# Patient Record
Sex: Female | Born: 1970 | Race: White | Hispanic: No | Marital: Married | Smoking: Never smoker
Health system: Southern US, Community
[De-identification: ages and names within clinical notes are randomized; demographics above are authoritative.]

## PROBLEM LIST (undated history)

## (undated) DIAGNOSIS — K589 Irritable bowel syndrome without diarrhea: Secondary | ICD-10-CM

## (undated) DIAGNOSIS — Z9289 Personal history of other medical treatment: Secondary | ICD-10-CM

## (undated) DIAGNOSIS — Z803 Family history of malignant neoplasm of breast: Secondary | ICD-10-CM

## (undated) DIAGNOSIS — L709 Acne, unspecified: Secondary | ICD-10-CM

## (undated) DIAGNOSIS — J302 Other seasonal allergic rhinitis: Secondary | ICD-10-CM

## (undated) DIAGNOSIS — Z1379 Encounter for other screening for genetic and chromosomal anomalies: Secondary | ICD-10-CM

## (undated) DIAGNOSIS — Z9189 Other specified personal risk factors, not elsewhere classified: Secondary | ICD-10-CM

## (undated) HISTORY — DX: Encounter for other screening for genetic and chromosomal anomalies: Z13.79

## (undated) HISTORY — DX: Other specified personal risk factors, not elsewhere classified: Z91.89

## (undated) HISTORY — PX: WISDOM TOOTH EXTRACTION: SHX21

## (undated) HISTORY — PX: MRI: SHX5353

## (undated) HISTORY — DX: Family history of malignant neoplasm of breast: Z80.3

## (undated) HISTORY — DX: Personal history of other medical treatment: Z92.89

## (undated) HISTORY — DX: Other seasonal allergic rhinitis: J30.2

## (undated) HISTORY — DX: Acne, unspecified: L70.9

---

## 2009-09-05 ENCOUNTER — Emergency Department: Payer: Self-pay | Admitting: Unknown Physician Specialty

## 2012-03-22 DIAGNOSIS — Z9289 Personal history of other medical treatment: Secondary | ICD-10-CM

## 2012-03-22 HISTORY — DX: Personal history of other medical treatment: Z92.89

## 2012-05-01 DIAGNOSIS — Z9289 Personal history of other medical treatment: Secondary | ICD-10-CM

## 2012-05-01 HISTORY — DX: Personal history of other medical treatment: Z92.89

## 2012-11-06 DIAGNOSIS — Z9189 Other specified personal risk factors, not elsewhere classified: Secondary | ICD-10-CM

## 2012-11-06 HISTORY — DX: Other specified personal risk factors, not elsewhere classified: Z91.89

## 2013-03-26 ENCOUNTER — Other Ambulatory Visit: Payer: Self-pay | Admitting: *Deleted

## 2013-03-26 DIAGNOSIS — Z803 Family history of malignant neoplasm of breast: Secondary | ICD-10-CM

## 2013-05-06 HISTORY — PX: BREAST BIOPSY: SHX20

## 2013-05-27 ENCOUNTER — Other Ambulatory Visit: Payer: Self-pay | Admitting: Radiology

## 2013-05-27 DIAGNOSIS — Z803 Family history of malignant neoplasm of breast: Secondary | ICD-10-CM

## 2013-06-02 ENCOUNTER — Ambulatory Visit
Admission: RE | Admit: 2013-06-02 | Discharge: 2013-06-02 | Disposition: A | Discharge status: Home / Self Care | Payer: 59 | Source: Ambulatory Visit | Attending: *Deleted | Admitting: *Deleted

## 2013-06-02 DIAGNOSIS — Z803 Family history of malignant neoplasm of breast: Secondary | ICD-10-CM

## 2013-06-02 MED ORDER — GADOBENATE DIMEGLUMINE 529 MG/ML IV SOLN
13.0000 mL | Freq: Once | INTRAVENOUS | Status: AC | PRN
  Administered 2013-06-02: 13 mL via INTRAVENOUS

## 2013-06-06 ENCOUNTER — Ambulatory Visit (INDEPENDENT_AMBULATORY_CARE_PROVIDER_SITE_OTHER): Payer: 59 | Admitting: General Surgery

## 2013-06-06 ENCOUNTER — Encounter (INDEPENDENT_AMBULATORY_CARE_PROVIDER_SITE_OTHER): Payer: Self-pay | Admitting: General Surgery

## 2013-06-06 VITALS — BP 122/72 | HR 72 | Temp 99.1°F | Resp 15 | Ht 67.0 in | Wt 148.0 lb

## 2013-06-06 DIAGNOSIS — R928 Other abnormal and inconclusive findings on diagnostic imaging of breast: Secondary | ICD-10-CM

## 2013-06-06 DIAGNOSIS — Z1379 Encounter for other screening for genetic and chromosomal anomalies: Secondary | ICD-10-CM

## 2013-06-06 HISTORY — DX: Encounter for other screening for genetic and chromosomal anomalies: Z13.79

## 2013-06-06 NOTE — Patient Instructions (Signed)
[facility] °Office Phone Number [phone] ° °BREAST BIOPSY/ PARTIAL MASTECTOMY: POST OP INSTRUCTIONS ° °Always review your discharge instruction sheet given to you by the facility where your surgery was performed. ° °IF YOU HAVE DISABILITY OR FAMILY LEAVE FORMS, YOU MUST BRING THEM TO THE OFFICE FOR PROCESSING.  DO NOT GIVE THEM TO YOUR DOCTOR. ° °1. A prescription for pain medication may be given to you upon discharge.  Take your pain medication as prescribed, if needed.  If narcotic pain medicine is not needed, then you may take acetaminophen (Tylenol), naprosyn (Alleve) or ibuprofen (Advil) as needed. °2. Take your usually prescribed medications unless otherwise directed °3. If you need a refill on your pain medication, please contact your pharmacy.  They will contact our office to request authorization.  Prescriptions will not be filled after 5pm or on week-ends. °4. You should eat very light the first 24 hours after surgery, such as soup, crackers, pudding, etc.  Resume your normal diet the day after surgery. °5. Most patients will experience some swelling and bruising in the breast.  Ice packs and a good support bra will help.  Wear the breast binder provided or a sports bra for 72 hours day and night.  After that wear a sports bra during the day until you return to the office. Swelling and bruising can take several days to resolve.  °6. It is common to experience some constipation if taking pain medication after surgery.  Increasing fluid intake and taking a stool softener will usually help or prevent this problem from occurring.  A mild laxative (Milk of Magnesia or Miralax) should be taken according to package directions if there are no bowel movements after 48 hours. °7. Unless discharge instructions indicate otherwise, you may remove your bandages 48 hours after surgery and you may shower at that time.  You may have steri-strips (small skin tapes) in place directly over the incision.   These strips should be left on the skin for 7-10 days and will come off on their own.  If your surgeon used skin glue on the incision, you may shower in 24 hours.  The glue will flake off over the next 2-3 weeks.  Any sutures or staples will be removed at the office during your follow-up visit. °8. ACTIVITIES:  You may resume regular daily activities (gradually increasing) beginning the next day.  Wearing a good support bra or sports bra minimizes pain and swelling.  You may have sexual intercourse when it is comfortable. °a. You may drive when you no longer are taking prescription pain medication, you can comfortably wear a seatbelt, and you can safely maneuver your car and apply brakes. °b. RETURN TO WORK:  ______________________________________________________________________________________ °9. You should see your doctor in the office for a follow-up appointment approximately two weeks after your surgery.  Your doctor’s nurse will typically make your follow-up appointment when she calls you with your pathology report.  Expect your pathology report 3-4 business days after your surgery.  You may call to check if you do not hear from us after three days. °10. OTHER INSTRUCTIONS: _______________________________________________________________________________________________ _____________________________________________________________________________________________________________________________________ °_____________________________________________________________________________________________________________________________________ °_____________________________________________________________________________________________________________________________________ ° °WHEN TO CALL DR Larnie Heart: °1. Fever over 101.0 °2. Nausea and/or vomiting. °3. Extreme swelling or bruising. °4. Continued bleeding from incision. °5. Increased pain, redness, or drainage from the incision. ° °The clinic staff is available to  answer your questions during regular business hours.  Please don’t hesitate to call and ask to speak to one of the nurses for   clinical concerns.  If you have a medical emergency, go to the nearest emergency room or call 911.  A surgeon from [facility] is always on call at the hospital. ° °For further questions, please visit [email or web address] ° °

## 2013-06-06 NOTE — Progress Notes (Signed)
Patient ID: Cynthia Haas, female   DOB: 09/17/1971, 41 y.o.   MRN: [id number]  Chief Complaint  Patient presents with  . New Evaluation    eval rt br radial scar    HPI Cynthia Haas is a 41 y.o. female.  Referred by Dr Margaret Bertrand HPI 41 yof with history of breast cancer in her mom at age 46 who has passed away.  Her mom underwent brca and bart testing that was negative around 2007.  She has genetic panel testing pending right now.  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  She has undergone an MR at outside facility in 11/2012 that showed an apparent 2.4 cm irregular enhancing mass in the outer right breast.  This was biopsied with clip placement.  Results apparently showed fibrocystic changes.  She then underwent evaluation on 7/7 at [facility] with mm that shows a 2.5 cm spiculated mass in the right breast.  Ultrasound shows a hypoechoic area at least 1.5 cm in diameter.  Biopsy was performed with results showing dense benign breast parenchyma with focal scattered chronic inflammatory changes.  This is not concordant with imaging. She also has another MR that shows this area to be larger.  She presents today with no real complaints to discuss plan.  History reviewed. No pertinent past medical history.  Past Surgical History  Procedure Laterality Date  . Breast biopsy  [id number]  . Mri      Family History  Problem Relation Age of Onset  . Cancer Mother     breast    Social History History  Substance Use Topics  . Smoking status: Never Smoker   . Smokeless tobacco: Never Used  . Alcohol Use: Yes     Comment: occasionally    No Known Allergies  Current Outpatient Prescriptions  Medication Sig Dispense Refill  . etonogestrel-ethinyl estradiol (NUVARING) 0.12-0.015 MG/24HR vaginal ring Place 1 each vaginally every 28 (twenty-eight) days. Insert vaginally and leave in place for 3 consecutive weeks, then remove for 1 week.      . fluticasone (FLONASE) 50 MCG/ACT nasal spray       .  tretinoin (RETIN-A) 0.025 % cream        No current facility-administered medications for this visit.    Review of Systems Review of Systems  Constitutional: Negative for fever, chills and unexpected weight change.  HENT: Negative for hearing loss, congestion, sore throat, trouble swallowing and voice change.   Eyes: Negative for visual disturbance.  Respiratory: Negative for cough and wheezing.   Cardiovascular: Negative for chest pain, palpitations and leg swelling.  Gastrointestinal: Negative for nausea, vomiting, abdominal pain, diarrhea, constipation, blood in stool, abdominal distention and anal bleeding.  Genitourinary: Negative for hematuria, vaginal bleeding and difficulty urinating.  Musculoskeletal: Negative for arthralgias.  Skin: Negative for rash and wound.  Neurological: Negative for seizures, syncope and headaches.  Hematological: Negative for adenopathy. Does not bruise/bleed easily.  Psychiatric/Behavioral: Negative for confusion.    Blood pressure 122/72, pulse 72, temperature 99.1 F (37.3 C), temperature source Temporal, resp. rate 15, height 5' 7" (1.702 m), weight 148 lb (67.132 kg).  Physical Exam Physical Exam  Vitals reviewed. Constitutional: She appears well-developed and well-nourished.  Neck: Neck supple.  Pulmonary/Chest: Right breast exhibits no inverted nipple, no mass, no nipple discharge, no skin change and no tenderness. Left breast exhibits no inverted nipple, no mass, no nipple discharge, no skin change and no tenderness.  Lymphadenopathy:    She has no cervical adenopathy.      She has no axillary adenopathy.       Right: No supraclavicular adenopathy present.       Left: No supraclavicular adenopathy present.    Data Reviewed Mm/us/path reviewed from [facility] BILATERAL BREAST MRI WITH AND WITHOUT CONTRAST  Technique: Multiplanar, multisequence MR images of both breasts  were obtained prior to and following the intravenous administration  of  13ml of multihance.  THREE-DIMENSIONAL MR IMAGE RENDERING ON INDEPENDENT WORKSTATION:  Three-dimensional MR images were rendered by post-processing of the  original MR data on an independent workstation. The three-  dimensional MR images were interpreted, and findings are reported  in the following complete MRI report for this study.  Comparison: Mammograms dated 05/14/2013, 05/12/2013, 05/01/2012  and 04/19/2011. Prior MRI dated 12/03/2012.  FINDINGS:  Breast composition: c: Heterogeneous fibroglandular tissue  Background parenchymal enhancement: Moderate  Right breast: In the posterior third of the upper outer quadrant  of the right breast there is irregular, non mass-like enhancement  measuring 3.1 x 1.1 x 2.4 cm. On the prior MRI dated 11/29/2012 it  measured 2.4 x 1.9 x 2.0 cm. The enhancement is associated with  two signal void artifacts corresponding with the ultrasound and MR  guided core biopsies of this area. No additional areas ofabnormal  enhancement are seen in the right breast.  Left breast: No mass or abnormal enhancement.  Lymph nodes: No abnormal appearing lymph nodes.  Ancillary findings: None.  IMPRESSION:  Suspicious enhancement in the upper outer quadrant of the right  breast felt to be discordant with the MR and ultrasound guided core  biopsy results.   Assessment    Right breast mass on imaging Family history     Plan    Right breast wire guided excision  Were discussed for the better part of an hour her risk as well as her evaluation up to this point. Her family history is very significant and I think her pending panel testing would be very reasonable. I think the best plan would be to await the genetic testing but also plan on proceeding with excision of this right breast mass. Her BRCA testing in her mom has previously been negative but she may have one of the more minor genetic mutations that goes along with breast cancer but I think to make a decision  on any further therapy she would need the results of his pathology as well. We discussed this at length of either waiting for the pathology results are proceeding. I think it would be best to just proceed with a right breast wire guided excision of this mass and then await her pathology results. We discussed the procedure. We discussed the risks and benefits associated with that. We'll plan on doing this in about 2 weeks.        Visente Kirker 06/06/2013, 10:05 AM    

## 2013-06-11 ENCOUNTER — Encounter (HOSPITAL_BASED_OUTPATIENT_CLINIC_OR_DEPARTMENT_OTHER): Payer: Self-pay | Admitting: *Deleted

## 2013-06-19 ENCOUNTER — Encounter (HOSPITAL_BASED_OUTPATIENT_CLINIC_OR_DEPARTMENT_OTHER)
Admission: RE | Disposition: A | Discharge status: Home / Self Care | Payer: Self-pay | Source: Ambulatory Visit | Attending: General Surgery

## 2013-06-19 ENCOUNTER — Encounter (HOSPITAL_BASED_OUTPATIENT_CLINIC_OR_DEPARTMENT_OTHER): Payer: Self-pay | Admitting: Certified Registered"

## 2013-06-19 ENCOUNTER — Ambulatory Visit (HOSPITAL_BASED_OUTPATIENT_CLINIC_OR_DEPARTMENT_OTHER): Payer: 59 | Admitting: Certified Registered"

## 2013-06-19 ENCOUNTER — Ambulatory Visit (HOSPITAL_BASED_OUTPATIENT_CLINIC_OR_DEPARTMENT_OTHER)
Admission: RE | Admit: 2013-06-19 | Discharge: 2013-06-19 | Disposition: A | Discharge status: Home / Self Care | Payer: 59 | Source: Ambulatory Visit | Attending: General Surgery | Admitting: General Surgery

## 2013-06-19 ENCOUNTER — Encounter (HOSPITAL_BASED_OUTPATIENT_CLINIC_OR_DEPARTMENT_OTHER): Payer: Self-pay | Admitting: *Deleted

## 2013-06-19 DIAGNOSIS — N6019 Diffuse cystic mastopathy of unspecified breast: Secondary | ICD-10-CM | POA: Insufficient documentation

## 2013-06-19 DIAGNOSIS — N6089 Other benign mammary dysplasias of unspecified breast: Secondary | ICD-10-CM

## 2013-06-19 DIAGNOSIS — Z803 Family history of malignant neoplasm of breast: Secondary | ICD-10-CM | POA: Insufficient documentation

## 2013-06-19 DIAGNOSIS — Z79899 Other long term (current) drug therapy: Secondary | ICD-10-CM | POA: Insufficient documentation

## 2013-06-19 HISTORY — PX: BREAST BIOPSY: SHX20

## 2013-06-19 HISTORY — DX: Irritable bowel syndrome, unspecified: K58.9

## 2013-06-19 LAB — POCT HEMOGLOBIN-HEMACUE: Hemoglobin: 14.4 g/dL (ref 12.0–15.0)

## 2013-06-19 SURGERY — BREAST BIOPSY WITH NEEDLE LOCALIZATION
Anesthesia: General | Site: Breast | Laterality: Right | Wound class: Clean

## 2013-06-19 MED ORDER — FENTANYL CITRATE 0.05 MG/ML IJ SOLN
INTRAMUSCULAR | Status: DC | PRN
  Administered 2013-06-19 (×2): 50 ug via INTRAVENOUS

## 2013-06-19 MED ORDER — OXYCODONE HCL 5 MG/5ML PO SOLN: 5.0000 mg | Freq: Once | ORAL | Status: DC | PRN

## 2013-06-19 MED ORDER — ONDANSETRON HCL 4 MG/2ML IJ SOLN: 4.0000 mg | Freq: Once | INTRAMUSCULAR | Status: DC | PRN

## 2013-06-19 MED ORDER — DEXAMETHASONE SODIUM PHOSPHATE 4 MG/ML IJ SOLN
INTRAMUSCULAR | Status: DC | PRN
  Administered 2013-06-19: 8 mg via INTRAVENOUS

## 2013-06-19 MED ORDER — MIDAZOLAM HCL 5 MG/5ML IJ SOLN
INTRAMUSCULAR | Status: DC | PRN
  Administered 2013-06-19: 2 mg via INTRAVENOUS

## 2013-06-19 MED ORDER — PROPOFOL 10 MG/ML IV BOLUS
INTRAVENOUS | Status: DC | PRN
  Administered 2013-06-19: 200 mg via INTRAVENOUS

## 2013-06-19 MED ORDER — OXYCODONE HCL 5 MG PO TABS: 5.0000 mg | ORAL_TABLET | Freq: Once | ORAL | Status: DC | PRN

## 2013-06-19 MED ORDER — 0.9 % SODIUM CHLORIDE (POUR BTL) OPTIME
TOPICAL | Status: DC | PRN
  Administered 2013-06-19: 1000 mL

## 2013-06-19 MED ORDER — ONDANSETRON HCL 4 MG/2ML IJ SOLN
INTRAMUSCULAR | Status: DC | PRN
  Administered 2013-06-19: 4 mg via INTRAVENOUS

## 2013-06-19 MED ORDER — LIDOCAINE HCL (CARDIAC) 20 MG/ML IV SOLN
INTRAVENOUS | Status: DC | PRN
  Administered 2013-06-19: 80 mg via INTRAVENOUS

## 2013-06-19 MED ORDER — LACTATED RINGERS IV SOLN
INTRAVENOUS | Status: DC | PRN
  Administered 2013-06-19 (×2): via INTRAVENOUS

## 2013-06-19 MED ORDER — HYDROMORPHONE HCL PF 1 MG/ML IJ SOLN
0.2500 mg | INTRAMUSCULAR | Status: DC | PRN
  Administered 2013-06-19 (×2): 0.5 mg via INTRAVENOUS

## 2013-06-19 MED ORDER — HYDROCODONE-ACETAMINOPHEN 10-325 MG PO TABS: 1.0000 | ORAL_TABLET | Freq: Four times a day (QID) | ORAL | Status: DC | PRN

## 2013-06-19 MED ORDER — BUPIVACAINE HCL (PF) 0.25 % IJ SOLN
INTRAMUSCULAR | Status: DC | PRN
  Administered 2013-06-19: 20 mL

## 2013-06-19 MED ORDER — CEFAZOLIN SODIUM-DEXTROSE 2-3 GM-% IV SOLR
INTRAVENOUS | Status: DC | PRN
  Administered 2013-06-19: 2 g via INTRAVENOUS

## 2013-06-19 SURGICAL SUPPLY — 53 items
APPLIER CLIP 9.375 MED OPEN (MISCELLANEOUS)
BENZOIN TINCTURE PRP APPL 2/3 (GAUZE/BANDAGES/DRESSINGS) IMPLANT
BINDER BREAST LRG (GAUZE/BANDAGES/DRESSINGS) IMPLANT
BINDER BREAST MEDIUM (GAUZE/BANDAGES/DRESSINGS) ×2 IMPLANT
BINDER BREAST XLRG (GAUZE/BANDAGES/DRESSINGS) IMPLANT
BINDER BREAST XXLRG (GAUZE/BANDAGES/DRESSINGS) IMPLANT
BLADE SURG 15 STRL LF DISP TIS (BLADE) ×1 IMPLANT
BLADE SURG 15 STRL SS (BLADE) ×1
CANISTER SUCTION 1200CC (MISCELLANEOUS) IMPLANT
CHLORAPREP W/TINT 26ML (MISCELLANEOUS) ×2 IMPLANT
CLIP APPLIE 9.375 MED OPEN (MISCELLANEOUS) IMPLANT
CLOTH BEACON ORANGE TIMEOUT ST (SAFETY) ×2 IMPLANT
COVER MAYO STAND STRL (DRAPES) ×2 IMPLANT
COVER TABLE BACK 60X90 (DRAPES) ×2 IMPLANT
DECANTER SPIKE VIAL GLASS SM (MISCELLANEOUS) IMPLANT
DERMABOND ADVANCED (GAUZE/BANDAGES/DRESSINGS)
DERMABOND ADVANCED .7 DNX12 (GAUZE/BANDAGES/DRESSINGS) IMPLANT
DEVICE DUBIN W/COMP PLATE 8390 (MISCELLANEOUS) ×2 IMPLANT
DRAPE PED LAPAROTOMY (DRAPES) ×2 IMPLANT
DRSG TEGADERM 4X4.75 (GAUZE/BANDAGES/DRESSINGS) ×2 IMPLANT
ELECT COATED BLADE 2.86 ST (ELECTRODE) ×2 IMPLANT
ELECT REM PT RETURN 9FT ADLT (ELECTROSURGICAL) ×2
ELECTRODE REM PT RTRN 9FT ADLT (ELECTROSURGICAL) ×1 IMPLANT
GAUZE SPONGE 4X4 12PLY STRL LF (GAUZE/BANDAGES/DRESSINGS) ×2 IMPLANT
GLOVE BIO SURGEON STRL SZ7 (GLOVE) ×2 IMPLANT
GLOVE BIOGEL PI IND STRL 7.5 (GLOVE) ×2 IMPLANT
GLOVE BIOGEL PI INDICATOR 7.5 (GLOVE) ×2
GLOVE EXAM NITRILE EXT CFF LRG (GLOVE) ×2 IMPLANT
GLOVE SURG SS PI 7.5 STRL IVOR (GLOVE) ×2 IMPLANT
GOWN PREVENTION PLUS XLARGE (GOWN DISPOSABLE) ×4 IMPLANT
KIT MARKER MARGIN INK (KITS) ×2 IMPLANT
NEEDLE HYPO 25X1 1.5 SAFETY (NEEDLE) ×2 IMPLANT
NS IRRIG 1000ML POUR BTL (IV SOLUTION) ×2 IMPLANT
PACK BASIN DAY SURGERY FS (CUSTOM PROCEDURE TRAY) ×2 IMPLANT
PENCIL BUTTON HOLSTER BLD 10FT (ELECTRODE) ×2 IMPLANT
SHEET MEDIUM DRAPE 40X70 STRL (DRAPES) ×2 IMPLANT
SLEEVE SCD COMPRESS KNEE MED (MISCELLANEOUS) ×2 IMPLANT
SPONGE LAP 4X18 X RAY DECT (DISPOSABLE) ×2 IMPLANT
STRIP CLOSURE SKIN 1/2X4 (GAUZE/BANDAGES/DRESSINGS) IMPLANT
SUT MNCRL AB 4-0 PS2 18 (SUTURE) IMPLANT
SUT MON AB 5-0 PS2 18 (SUTURE) IMPLANT
SUT SILK 2 0 SH (SUTURE) ×2 IMPLANT
SUT VIC AB 2-0 SH 27 (SUTURE) ×1
SUT VIC AB 2-0 SH 27XBRD (SUTURE) ×1 IMPLANT
SUT VIC AB 3-0 SH 27 (SUTURE) ×1
SUT VIC AB 3-0 SH 27X BRD (SUTURE) ×1 IMPLANT
SUT VIC AB 5-0 PS2 18 (SUTURE) IMPLANT
SUT VICRYL AB 3 0 TIES (SUTURE) IMPLANT
SYR CONTROL 10ML LL (SYRINGE) ×2 IMPLANT
TOWEL OR 17X24 6PK STRL BLUE (TOWEL DISPOSABLE) ×2 IMPLANT
TOWEL OR NON WOVEN STRL DISP B (DISPOSABLE) IMPLANT
TUBE CONNECTING 20X1/4 (TUBING) IMPLANT
YANKAUER SUCT BULB TIP NO VENT (SUCTIONS) IMPLANT

## 2013-06-19 NOTE — Op Note (Signed)
Preoperative diagnosis: Right breast abnormality on MRI, family history Postoperative diagnosis: Same as above Procedure: Right breast wire guided excision Surgeon: Dr. Harden Mo Anesthesia: Gen. 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 She does have a significant family history. We discussed all of her options due to the persistence of this area in her right breast and we decided on performing a right breast wire guided excision. She is obtaining genetic testing which is still pending. We discussed obtaining both of these results and then decide how to proceed any further.  Procedure: After informed consent was obtained the patient was first taken to breast center. She had a wire placed in the lesion. I had these mammograms in the operating room. She was given 2 g of intravenous cefazolin. Sequential compression devices were placed on her legs. She was in place her general anesthesia with an LMA. Her right breast was prepped and draped in the standard sterile surgical fashion. A surgical timeout was performed.  I infiltrated Marcaine throughout the region of the lesion. I then made a curvilinear incision in the very lateral breast away from the where the wire and the lesion were. I then used cautery to dissect down to where the lesion was. I brought the wire in from its remote position. I then used cautery to excise the wire and the surrounding tissue. This was then painted and passed off the table as a specimen. AFaxitron mammogram confirmed removal of the wire and the clips. This was also confirmed by radiology. I obtained hemostasis. I closed the breast tissue with a 2-0 Vicryl suture. I then closed the  dermis with 3-0 Vicryl and the skin with 4-0 Monocryl. I injected more Marcaine. I then placed Dermabond, Steri-Strips, and a breast binder. She tolerated this well and was transferred to recovery stable.

## 2013-06-19 NOTE — Anesthesia Procedure Notes (Signed)
Procedure Name: LMA Insertion Date/Time: 06/19/2013 9:22 AM Performed by: Renella Cunas D Pre-anesthesia Checklist: Patient identified, Emergency Drugs available, Suction available and Patient being monitored Patient Re-evaluated:Patient Re-evaluated prior to inductionOxygen Delivery Method: Circle System Utilized Preoxygenation: Pre-oxygenation with 100% oxygen Intubation Type: IV induction Ventilation: Mask ventilation without difficulty LMA: LMA inserted LMA Size: 4.0 Number of attempts: 1 Airway Equipment and Method: bite block Placement Confirmation: positive ETCO2 Tube secured with: Tape Dental Injury: Teeth and Oropharynx as per pre-operative assessment

## 2013-06-19 NOTE — H&P (View-Only) (Signed)
Patient ID: Cynthia Haas, female   DOB: [birth year], 42 y.o.   MRN: [id number]  Chief Complaint  Patient presents with  . New Evaluation    eval rt br radial scar    HPI Cynthia Haas is a 42 y.o. female.  Referred by Dr Jeralyn Ruths HPI 51 yof with history of breast cancer in her mom at age 56 who has passed away.  Her mom underwent brca and bart testing that was negative around 2007.  She has genetic panel testing pending right now.  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  She has undergone an MR at outside facility in 11/2012 that showed an apparent 2.4 cm irregular enhancing mass in the outer right breast.  This was biopsied with clip placement.  Results apparently showed fibrocystic changes.  She then underwent evaluation on 7/7 at [facility] with mm that shows a 2.5 cm spiculated mass in the right breast.  Ultrasound shows a hypoechoic area at least 1.5 cm in diameter.  Biopsy was performed with results showing dense benign breast parenchyma with focal scattered chronic inflammatory changes.  This is not concordant with imaging. She also has another MR that shows this area to be larger.  She presents today with no real complaints to discuss plan.  History reviewed. No pertinent past medical history.  Past Surgical History  Procedure Laterality Date  . Breast biopsy  [id number]  . Mri      Family History  Problem Relation Age of Onset  . Cancer Mother     breast    Social History History  Substance Use Topics  . Smoking status: Never Smoker   . Smokeless tobacco: Never Used  . Alcohol Use: Yes     Comment: occasionally    No Known Allergies  Current Outpatient Prescriptions  Medication Sig Dispense Refill  . etonogestrel-ethinyl estradiol (NUVARING) 0.12-0.015 MG/24HR vaginal ring Place 1 each vaginally every 28 (twenty-eight) days. Insert vaginally and leave in place for 3 consecutive weeks, then remove for 1 week.      . fluticasone (FLONASE) 50 MCG/ACT nasal spray       .  tretinoin (RETIN-A) 0.025 % cream        No current facility-administered medications for this visit.    Review of Systems Review of Systems  Constitutional: Negative for fever, chills and unexpected weight change.  HENT: Negative for hearing loss, congestion, sore throat, trouble swallowing and voice change.   Eyes: Negative for visual disturbance.  Respiratory: Negative for cough and wheezing.   Cardiovascular: Negative for chest pain, palpitations and leg swelling.  Gastrointestinal: Negative for nausea, vomiting, abdominal pain, diarrhea, constipation, blood in stool, abdominal distention and anal bleeding.  Genitourinary: Negative for hematuria, vaginal bleeding and difficulty urinating.  Musculoskeletal: Negative for arthralgias.  Skin: Negative for rash and wound.  Neurological: Negative for seizures, syncope and headaches.  Hematological: Negative for adenopathy. Does not bruise/bleed easily.  Psychiatric/Behavioral: Negative for confusion.    Blood pressure 122/72, pulse 72, temperature 99.1 F (37.3 C), temperature source Temporal, resp. rate 15, height 5\' 7"  (1.702 m), weight 148 lb (67.132 kg).  Physical Exam Physical Exam  Vitals reviewed. Constitutional: She appears well-developed and well-nourished.  Neck: Neck supple.  Pulmonary/Chest: Right breast exhibits no inverted nipple, no mass, no nipple discharge, no skin change and no tenderness. Left breast exhibits no inverted nipple, no mass, no nipple discharge, no skin change and no tenderness.  Lymphadenopathy:    She has no cervical adenopathy.  She has no axillary adenopathy.       Right: No supraclavicular adenopathy present.       Left: No supraclavicular adenopathy present.    Data Reviewed Mm/us/path reviewed from [facility] BILATERAL BREAST MRI WITH AND WITHOUT CONTRAST  Technique: Multiplanar, multisequence MR images of both breasts  were obtained prior to and following the intravenous administration  of  13ml of multihance.  THREE-DIMENSIONAL MR IMAGE RENDERING ON INDEPENDENT WORKSTATION:  Three-dimensional MR images were rendered by post-processing of the  original MR data on an independent workstation. The three-  dimensional MR images were interpreted, and findings are reported  in the following complete MRI report for this study.  Comparison: Mammograms dated 05/14/2013, 05/12/2013, 05/01/2012  and 04/19/2011. Prior MRI dated 12/03/2012.  FINDINGS:  Breast composition: c: Heterogeneous fibroglandular tissue  Background parenchymal enhancement: Moderate  Right breast: In the posterior third of the upper outer quadrant  of the right breast there is irregular, non mass-like enhancement  measuring 3.1 x 1.1 x 2.4 cm. On the prior MRI dated 11/29/2012 it  measured 2.4 x 1.9 x 2.0 cm. The enhancement is associated with  two signal void artifacts corresponding with the ultrasound and MR  guided core biopsies of this area. No additional areas ofabnormal  enhancement are seen in the right breast.  Left breast: No mass or abnormal enhancement.  Lymph nodes: No abnormal appearing lymph nodes.  Ancillary findings: None.  IMPRESSION:  Suspicious enhancement in the upper outer quadrant of the right  breast felt to be discordant with the MR and ultrasound guided core  biopsy results.   Assessment    Right breast mass on imaging Family history     Plan    Right breast wire guided excision  Were discussed for the better part of an hour her risk as well as her evaluation up to this point. Her family history is very significant and I think her pending panel testing would be very reasonable. I think the best plan would be to await the genetic testing but also plan on proceeding with excision of this right breast mass. Her BRCA testing in her mom has previously been negative but she may have one of the more minor genetic mutations that goes along with breast cancer but I think to make a decision  on any further therapy she would need the results of his pathology as well. We discussed this at length of either waiting for the pathology results are proceeding. I think it would be best to just proceed with a right breast wire guided excision of this mass and then await her pathology results. We discussed the procedure. We discussed the risks and benefits associated with that. We'll plan on doing this in about 2 weeks.        Letta Cargile 06/06/2013, 10:05 AM

## 2013-06-19 NOTE — Anesthesia Postprocedure Evaluation (Signed)
  Anesthesia Post-op Note  Patient: Cynthia Haas  Procedure(s) Performed: Procedure(s):  RIGHT BREAST EXCISION WITH NEEDLE LOCALIZATION (Right)  Patient Location: PACU  Anesthesia Type:General  Level of Consciousness: awake, alert  and oriented  Airway and Oxygen Therapy: Patient Spontanous Breathing and Patient connected to face mask oxygen  Post-op Pain: mild  Post-op Assessment: Post-op Vital signs reviewed  Post-op Vital Signs: Reviewed  Complications: No apparent anesthesia complications

## 2013-06-19 NOTE — Transfer of Care (Signed)
Immediate Anesthesia Transfer of Care Note  Patient: Cynthia Haas  Procedure(s) Performed: Procedure(s) (LRB):  RIGHT BREAST EXCISION WITH NEEDLE LOCALIZATION (Right)  Patient Location: PACU  Anesthesia Type: General  Level of Consciousness: awake, oriented, sedated and patient cooperative  Airway & Oxygen Therapy: Patient Spontanous Breathing and Patient connected to face mask oxygen  Post-op Assessment: Report given to PACU RN and Post -op Vital signs reviewed and stable  Post vital signs: Reviewed and stable  Complications: No apparent anesthesia complications

## 2013-06-19 NOTE — Anesthesia Preprocedure Evaluation (Signed)
Anesthesia Evaluation  Patient identified by MRN, date of birth, ID band Patient awake    Reviewed: Allergy & Precautions, H&P , NPO status , Patient's Chart, lab work & pertinent test results  Airway Mallampati: I TM Distance: >3 FB Neck ROM: Full    Dental  (+) Teeth Intact and Dental Advisory Given   Pulmonary  breath sounds clear to auscultation        Cardiovascular Rhythm:Regular Rate:Normal     Neuro/Psych    GI/Hepatic   Endo/Other    Renal/GU      Musculoskeletal   Abdominal   Peds  Hematology   Anesthesia Other Findings   Reproductive/Obstetrics                           Anesthesia Physical Anesthesia Plan  ASA: I  Anesthesia Plan: General   Post-op Pain Management:    Induction: Intravenous  Airway Management Planned: LMA  Additional Equipment:   Intra-op Plan:   Post-operative Plan:   Informed Consent: I have reviewed the patients History and Physical, chart, labs and discussed the procedure including the risks, benefits and alternatives for the proposed anesthesia with the patient or authorized representative who has indicated his/her understanding and acceptance.   Dental advisory given  Plan Discussed with: CRNA, Anesthesiologist and Surgeon  Anesthesia Plan Comments:         Anesthesia Quick Evaluation

## 2013-06-19 NOTE — Interval H&P Note (Signed)
History and Physical Interval Note:  06/19/2013 9:12 AM  Cynthia Haas  has presented today for surgery, with the diagnosis of right breast abnormality  The various methods of treatment have been discussed with the patient and family. After consideration of risks, benefits and other options for treatment, the patient has consented to  Procedure(s):  RIGHT BREAST EXCISION WITH NEEDLE LOCALIZATION (Right) as a surgical intervention .  The patient's history has been reviewed, patient examined, no change in status, stable for surgery.  I have reviewed the patient's chart and labs.  Questions were answered to the patient's satisfaction.     Alysah Carton

## 2013-06-20 ENCOUNTER — Encounter (HOSPITAL_BASED_OUTPATIENT_CLINIC_OR_DEPARTMENT_OTHER): Payer: Self-pay | Admitting: General Surgery

## 2013-06-20 ENCOUNTER — Other Ambulatory Visit: Payer: Self-pay

## 2013-06-24 ENCOUNTER — Encounter (INDEPENDENT_AMBULATORY_CARE_PROVIDER_SITE_OTHER): Payer: Self-pay

## 2013-07-01 ENCOUNTER — Ambulatory Visit (INDEPENDENT_AMBULATORY_CARE_PROVIDER_SITE_OTHER): Payer: 59 | Admitting: General Surgery

## 2013-07-01 ENCOUNTER — Encounter (INDEPENDENT_AMBULATORY_CARE_PROVIDER_SITE_OTHER): Payer: Self-pay | Admitting: General Surgery

## 2013-07-01 VITALS — BP 112/80 | HR 89 | Temp 97.8°F | Resp 16 | Ht 67.0 in | Wt 148.4 lb

## 2013-07-01 DIAGNOSIS — Z09 Encounter for follow-up examination after completed treatment for conditions other than malignant neoplasm: Secondary | ICD-10-CM

## 2013-07-01 NOTE — Patient Instructions (Signed)
Breast Self-Examination You should begin examining your breasts at age 40 even though the risk for breast cancer is low in this age group. It is important to become familiar with how your breasts look and feel. This is true for pregnant women, nursing mothers, women in menopause and women who have breast implants.  Women should examine their breasts once a month to look for changes and lumps. By doing monthly breast exams, you get to know how your breasts feel and how they can change from month to month. This allows you to pick up changes early. It can also offer you some reassurance that your breast health is good. This exam only takes minutes. Most breast lumps are not caused by cancer. If you find a lump, a special x-ray called a mammogram, or other tests may be needed to determine what is wrong.  Some of the signs that a breast lump is caused by cancer include:  Dimpling of the skin or changes in the shape of the breast or nipple.   A dark-colored or bloody discharge from the nipple.   Swollen lymph glands around the breast or in the armpit.   Redness of the breast or nipple.   Scaly nipple or skin on the breast.   Pain or swelling of the breast.  SELF-EXAM There are a few points to follow when doing a thorough breast exam. The best time to examine your breasts is 5 to 7 days after the menstrual period is over. During menstruation, the breasts are lumpier, and it may be more difficult to pick up changes. If you do not menstruate, have reached menopause or had a hysterectomy, examine your breasts the first day of every month. After three to four months, you will become more familiar with the variations of your breasts and more comfortable with the exam.  Perform your breast exam monthly. Keep a written record with breast changes or normal findings for each breast. This makes it easier to be sure of changes and to not solely depend on memory for size, tenderness, or location. Try to do the exam  at the same time each month, and write down where you are in your menstrual cycle if you are still menstruating.   Look at your breasts. Stand in front of a mirror with your hands clasped behind your head. Tighten your chest muscles and look for asymmetry. This means a difference in shape or contour from one breast to the other, such as puckers, dips or bumps. Look also for skin changes.   Lean forward with your hands on your hips. Again, look for symmetry and skin changes.   While showering, soap the breasts, and carefully feel the breasts with fingertips while holding the arm (on the side of the breast being examined) over the head. Do this with each breast carefully feeling for lumps or changes. Typically, a circular motion with moderate fingertip pressure should be used.   Repeat this exam while lying on your back, again with your arm behind your head and a pillow under your shoulders. Again, use your fingertips to examine both breasts, feeling for lumps and thickening. Begin at 1 o'clock and go clockwise around the whole breast.   At the end of your exam, gently squeeze each nipple to see if there is any drainage. Look for nipple changes, dimpling or redness.   Lastly, examine the upper chest and clavicle areas and in your armpits.  It is not necessary to become alarmed if you find   a breast lump. Most of them are not cancerous. However, it is necessary to see your caregiver if a lump is found in order to have it looked at. Document Released: 11/30/2004 Document Revised: 07/05/2011 Document Reviewed: 02/09/2009 [employer] Patient Information 2012 [employer]. 

## 2013-07-02 NOTE — Progress Notes (Signed)
Subjective:     Patient ID: Cynthia Haas, female   DOB: September 16, 1971, 42 y.o.   MRN: [id number]  HPI 41 yof who has significant family history and underwent biopsy of mr found lesion.  I did a wire guided biopsy of this lesion with the path showing a possible radial scar/CSL.  Also with fibroycystic changes.  She has done well and is without complaint today.  She has faxed me her genetic panel testing and this is negative for any mutation.s   Review of Systems     Objective:   Physical Exam Right breast incision healing well without infection    Assessment:     High risk breast cancer S/p right breast biopsy    Plan:    She has done well and I released her to full activity.  We discussed follow up including mr/mm annually along with clinical and self exams.  I offered her appt to our high risk clinic and she can call if needed.  Otherwise I will see back as needed

## 2013-09-09 ENCOUNTER — Encounter (INDEPENDENT_AMBULATORY_CARE_PROVIDER_SITE_OTHER): Payer: Self-pay

## 2014-07-02 ENCOUNTER — Other Ambulatory Visit: Payer: Self-pay | Admitting: Certified Nurse Midwife

## 2014-07-02 DIAGNOSIS — Z803 Family history of malignant neoplasm of breast: Secondary | ICD-10-CM

## 2014-07-10 ENCOUNTER — Ambulatory Visit
Admission: RE | Admit: 2014-07-10 | Discharge: 2014-07-10 | Disposition: A | Discharge status: Home / Self Care | Payer: 59 | Source: Ambulatory Visit | Attending: Certified Nurse Midwife | Admitting: Certified Nurse Midwife

## 2014-07-10 DIAGNOSIS — Z803 Family history of malignant neoplasm of breast: Secondary | ICD-10-CM

## 2014-07-10 MED ORDER — GADOBENATE DIMEGLUMINE 529 MG/ML IV SOLN
13.0000 mL | Freq: Once | INTRAVENOUS | Status: AC | PRN
  Administered 2014-07-10: 13 mL via INTRAVENOUS

## 2014-11-06 HISTORY — PX: IR SCLEROTHERAPY OF A FLUID COLLECTION: IMG6090

## 2015-07-20 ENCOUNTER — Other Ambulatory Visit: Payer: Self-pay | Admitting: Certified Nurse Midwife

## 2015-07-20 DIAGNOSIS — Z1239 Encounter for other screening for malignant neoplasm of breast: Secondary | ICD-10-CM

## 2015-07-20 DIAGNOSIS — Z803 Family history of malignant neoplasm of breast: Secondary | ICD-10-CM

## 2015-08-25 ENCOUNTER — Other Ambulatory Visit: Payer: Self-pay

## 2015-09-06 ENCOUNTER — Ambulatory Visit
Admission: RE | Admit: 2015-09-06 | Discharge: 2015-09-06 | Disposition: A | Discharge status: Home / Self Care | Payer: Commercial Managed Care - HMO | Source: Ambulatory Visit | Attending: Certified Nurse Midwife | Admitting: Certified Nurse Midwife

## 2015-09-06 DIAGNOSIS — Z803 Family history of malignant neoplasm of breast: Secondary | ICD-10-CM

## 2015-09-06 DIAGNOSIS — Z1239 Encounter for other screening for malignant neoplasm of breast: Secondary | ICD-10-CM

## 2015-09-06 MED ORDER — GADOBENATE DIMEGLUMINE 529 MG/ML IV SOLN: 13.0000 mL | Freq: Once | INTRAVENOUS | Status: DC | PRN

## 2016-06-28 DIAGNOSIS — M9903 Segmental and somatic dysfunction of lumbar region: Secondary | ICD-10-CM | POA: Diagnosis not present

## 2016-06-28 DIAGNOSIS — M9905 Segmental and somatic dysfunction of pelvic region: Secondary | ICD-10-CM | POA: Diagnosis not present

## 2016-06-28 DIAGNOSIS — G441 Vascular headache, not elsewhere classified: Secondary | ICD-10-CM | POA: Diagnosis not present

## 2016-06-28 DIAGNOSIS — M9901 Segmental and somatic dysfunction of cervical region: Secondary | ICD-10-CM | POA: Diagnosis not present

## 2016-07-27 DIAGNOSIS — M9905 Segmental and somatic dysfunction of pelvic region: Secondary | ICD-10-CM | POA: Diagnosis not present

## 2016-07-27 DIAGNOSIS — G441 Vascular headache, not elsewhere classified: Secondary | ICD-10-CM | POA: Diagnosis not present

## 2016-07-27 DIAGNOSIS — M9901 Segmental and somatic dysfunction of cervical region: Secondary | ICD-10-CM | POA: Diagnosis not present

## 2016-07-27 DIAGNOSIS — M9903 Segmental and somatic dysfunction of lumbar region: Secondary | ICD-10-CM | POA: Diagnosis not present

## 2016-08-10 ENCOUNTER — Other Ambulatory Visit: Payer: Self-pay | Admitting: Certified Nurse Midwife

## 2016-08-10 DIAGNOSIS — Z803 Family history of malignant neoplasm of breast: Secondary | ICD-10-CM

## 2016-08-24 DIAGNOSIS — M9901 Segmental and somatic dysfunction of cervical region: Secondary | ICD-10-CM | POA: Diagnosis not present

## 2016-08-24 DIAGNOSIS — M9905 Segmental and somatic dysfunction of pelvic region: Secondary | ICD-10-CM | POA: Diagnosis not present

## 2016-08-24 DIAGNOSIS — M9903 Segmental and somatic dysfunction of lumbar region: Secondary | ICD-10-CM | POA: Diagnosis not present

## 2016-08-24 DIAGNOSIS — G441 Vascular headache, not elsewhere classified: Secondary | ICD-10-CM | POA: Diagnosis not present

## 2016-08-28 ENCOUNTER — Other Ambulatory Visit: Payer: Commercial Managed Care - HMO

## 2016-09-08 ENCOUNTER — Ambulatory Visit
Admission: RE | Admit: 2016-09-08 | Discharge: 2016-09-08 | Disposition: A | Discharge status: Home / Self Care | Payer: 59 | Source: Ambulatory Visit | Attending: Certified Nurse Midwife | Admitting: Certified Nurse Midwife

## 2016-09-08 DIAGNOSIS — Z803 Family history of malignant neoplasm of breast: Secondary | ICD-10-CM

## 2016-09-08 DIAGNOSIS — Z853 Personal history of malignant neoplasm of breast: Secondary | ICD-10-CM | POA: Diagnosis not present

## 2016-09-08 MED ORDER — GADOBENATE DIMEGLUMINE 529 MG/ML IV SOLN
15.0000 mL | Freq: Once | INTRAVENOUS | Status: AC | PRN
  Administered 2016-09-08: 13 mL via INTRAVENOUS

## 2016-09-21 DIAGNOSIS — M9905 Segmental and somatic dysfunction of pelvic region: Secondary | ICD-10-CM | POA: Diagnosis not present

## 2016-09-21 DIAGNOSIS — M9901 Segmental and somatic dysfunction of cervical region: Secondary | ICD-10-CM | POA: Diagnosis not present

## 2016-09-21 DIAGNOSIS — M9903 Segmental and somatic dysfunction of lumbar region: Secondary | ICD-10-CM | POA: Diagnosis not present

## 2016-09-21 DIAGNOSIS — G441 Vascular headache, not elsewhere classified: Secondary | ICD-10-CM | POA: Diagnosis not present

## 2016-10-20 DIAGNOSIS — M9901 Segmental and somatic dysfunction of cervical region: Secondary | ICD-10-CM | POA: Diagnosis not present

## 2016-10-20 DIAGNOSIS — M9903 Segmental and somatic dysfunction of lumbar region: Secondary | ICD-10-CM | POA: Diagnosis not present

## 2016-10-20 DIAGNOSIS — M9905 Segmental and somatic dysfunction of pelvic region: Secondary | ICD-10-CM | POA: Diagnosis not present

## 2016-10-20 DIAGNOSIS — G441 Vascular headache, not elsewhere classified: Secondary | ICD-10-CM | POA: Diagnosis not present

## 2016-12-07 DIAGNOSIS — M9903 Segmental and somatic dysfunction of lumbar region: Secondary | ICD-10-CM | POA: Diagnosis not present

## 2016-12-07 DIAGNOSIS — M9901 Segmental and somatic dysfunction of cervical region: Secondary | ICD-10-CM | POA: Diagnosis not present

## 2016-12-07 DIAGNOSIS — M9905 Segmental and somatic dysfunction of pelvic region: Secondary | ICD-10-CM | POA: Diagnosis not present

## 2016-12-07 DIAGNOSIS — G441 Vascular headache, not elsewhere classified: Secondary | ICD-10-CM | POA: Diagnosis not present

## 2017-02-16 DIAGNOSIS — M9905 Segmental and somatic dysfunction of pelvic region: Secondary | ICD-10-CM | POA: Diagnosis not present

## 2017-02-16 DIAGNOSIS — M9903 Segmental and somatic dysfunction of lumbar region: Secondary | ICD-10-CM | POA: Diagnosis not present

## 2017-02-16 DIAGNOSIS — G441 Vascular headache, not elsewhere classified: Secondary | ICD-10-CM | POA: Diagnosis not present

## 2017-02-16 DIAGNOSIS — M9901 Segmental and somatic dysfunction of cervical region: Secondary | ICD-10-CM | POA: Diagnosis not present

## 2017-04-20 DIAGNOSIS — M9903 Segmental and somatic dysfunction of lumbar region: Secondary | ICD-10-CM | POA: Diagnosis not present

## 2017-04-20 DIAGNOSIS — M9905 Segmental and somatic dysfunction of pelvic region: Secondary | ICD-10-CM | POA: Diagnosis not present

## 2017-04-20 DIAGNOSIS — M9901 Segmental and somatic dysfunction of cervical region: Secondary | ICD-10-CM | POA: Diagnosis not present

## 2017-04-20 DIAGNOSIS — G441 Vascular headache, not elsewhere classified: Secondary | ICD-10-CM | POA: Diagnosis not present

## 2017-04-23 ENCOUNTER — Other Ambulatory Visit: Payer: Self-pay | Admitting: Certified Nurse Midwife

## 2017-04-23 ENCOUNTER — Other Ambulatory Visit: Payer: Self-pay

## 2017-04-23 MED ORDER — FLUCONAZOLE 150 MG PO TABS: 150.0000 mg | ORAL_TABLET | ORAL | 0 refills | Status: DC

## 2017-04-23 MED ORDER — FLUCONAZOLE 150 MG PO TABS: 150.0000 mg | ORAL_TABLET | ORAL | 0 refills | Status: AC

## 2017-05-18 DIAGNOSIS — M9905 Segmental and somatic dysfunction of pelvic region: Secondary | ICD-10-CM | POA: Diagnosis not present

## 2017-05-18 DIAGNOSIS — M9903 Segmental and somatic dysfunction of lumbar region: Secondary | ICD-10-CM | POA: Diagnosis not present

## 2017-05-18 DIAGNOSIS — G441 Vascular headache, not elsewhere classified: Secondary | ICD-10-CM | POA: Diagnosis not present

## 2017-05-18 DIAGNOSIS — M9901 Segmental and somatic dysfunction of cervical region: Secondary | ICD-10-CM | POA: Diagnosis not present

## 2017-05-23 ENCOUNTER — Ambulatory Visit (INDEPENDENT_AMBULATORY_CARE_PROVIDER_SITE_OTHER): Payer: 59 | Admitting: Certified Nurse Midwife

## 2017-05-23 ENCOUNTER — Encounter: Payer: Self-pay | Admitting: Certified Nurse Midwife

## 2017-05-23 VITALS — BP 102/68 | HR 66 | Ht 67.0 in | Wt 147.0 lb

## 2017-05-23 DIAGNOSIS — J302 Other seasonal allergic rhinitis: Secondary | ICD-10-CM | POA: Insufficient documentation

## 2017-05-23 DIAGNOSIS — Z124 Encounter for screening for malignant neoplasm of cervix: Secondary | ICD-10-CM

## 2017-05-23 DIAGNOSIS — Z803 Family history of malignant neoplasm of breast: Secondary | ICD-10-CM | POA: Insufficient documentation

## 2017-05-23 DIAGNOSIS — Z9189 Other specified personal risk factors, not elsewhere classified: Secondary | ICD-10-CM

## 2017-05-23 DIAGNOSIS — Z01419 Encounter for gynecological examination (general) (routine) without abnormal findings: Secondary | ICD-10-CM

## 2017-05-23 DIAGNOSIS — Z1329 Encounter for screening for other suspected endocrine disorder: Secondary | ICD-10-CM | POA: Diagnosis not present

## 2017-05-23 DIAGNOSIS — L709 Acne, unspecified: Secondary | ICD-10-CM | POA: Insufficient documentation

## 2017-05-23 DIAGNOSIS — Z0000 Encounter for general adult medical examination without abnormal findings: Secondary | ICD-10-CM | POA: Diagnosis not present

## 2017-05-23 NOTE — Progress Notes (Signed)
Gynecology Annual Exam  PCP: Cynthia Haas, CNM  Chief Complaint:  Chief Complaint  Patient presents with  . Gynecologic Exam    History of Present Illness:Cynthia Haas is a 46 year old Caucasian/White female , G 1 P 1 0 0 1 , who presents for her annual exam . She is having no significant gyn concerns. Her menses on LoLoestrin Fe are only spotting for a couple of days if she does have a menses. She denies dysmenorrhea.  The patient's past medical history is notable for a family history of breast cancer of her mother at age 19 and her MGGMs hx of breast cancer in her 76s.Cynthia Haas has had negative MyRISK genetic testing, but her lifetime risk of breast cancer using the TC model is still 29.2%. She has been getting annual mammograms and MRIs as recommended. She had a right breast biopsy in 2014 for an abnormal MRI finding which was benign.  Since her last annual GYN exam dated 05/19/2017 , she has had no significant changes in her health.  She is sexually active. She is currently using birth control pills for contraception.  Her most recent pap smear was obtained 04/30/2014 and was with negative cells and negative HPV DNA.  Her most recent mammogram obtained on 06/06/2016 revealed benign changes from a right breast biopsy. An MRI 09/08/2016 also was negative for any significant changes. Her next 3D mammogram is scheduled for next month.  There is a positive history of breast cancer in her mother. Genetic testing has been done. The patient tested negative for BRCA 1, negative for BRCA 2, and negative for BART( actually had a negative MYRISK).  There is no family history of ovarian cancer.  The patient does do monthly self breast exams.  The patient does not smoke.  The patient does drink occasionally.  The patient does not use illegal drugs.  The patient exercises regularly.  The patient does get adequate calcium in her diet.  She had a recent cholesterol screen in 2017 that was  normal.  The patient denies current symptoms of depression.    Review of Systems: Review of Systems  Constitutional: Negative for chills, fever and weight loss.  HENT: Negative for congestion, sinus pain and sore throat.   Eyes: Negative for blurred vision and pain.  Respiratory: Negative for hemoptysis, shortness of breath and wheezing.   Cardiovascular: Negative for chest pain, palpitations and leg swelling.  Gastrointestinal: Negative for abdominal pain, blood in stool, diarrhea, heartburn, nausea and vomiting.  Genitourinary: Negative for dysuria, frequency, hematuria and urgency.  Musculoskeletal: Negative for back pain, joint pain and myalgias.  Skin: Negative for itching and rash.  Neurological: Negative for dizziness, tingling and headaches.  Endo/Heme/Allergies: Negative for environmental allergies and polydipsia. Does not bruise/bleed easily.       Negative for hirsutism   Psychiatric/Behavioral: Negative for depression. The patient is not nervous/anxious and does not have insomnia.     Past Medical History:  Past Medical History:  Diagnosis Date  . Acne   . Family history of breast cancer    mom was BRCA 1/2 neg. Patient is MYRISK negative. Lifetime risk 29.2%  . Increased risk of breast cancer 2014   last mammogram 06/06/2016 benign; last breast MRI-09/08/2016 neg  . Irritable bowel disease   . Seasonal allergies     Past Surgical History:  Past Surgical History:  Procedure Laterality Date  . BREAST BIOPSY  [id number]  . BREAST BIOPSY Right 06/19/2013  Procedure:  RIGHT BREAST EXCISION WITH NEEDLE LOCALIZATION;  Surgeon: Rolm Bookbinder, MD;  Location: [facility];  Service: General;  Laterality: Right;  . MRI    . WISDOM TOOTH EXTRACTION      Family History:  Family History  Problem Relation Age of Onset  . Cancer Mother 46       breast, recurrence at age 45  . Hyperlipidemia Mother   . Thyroid disease Mother        hypothyroidism  .  Hypertension Father   . Thyroid disease Maternal Grandmother   . Heart disease Maternal Grandfather   . Hypertension Maternal Grandfather   . Prostate cancer Maternal Grandfather 24  . Hypertension Paternal Grandmother   . Breast cancer Other 71    Social History:  Social History   Social History  . Marital status: Married    Spouse name: Cynthia Haas  . Number of children: 1  . Years of education: N/A   Occupational History  . physician assistant    Social History Main Topics  . Smoking status: Never Smoker  . Smokeless tobacco: Never Used  . Alcohol use Yes     Comment: occasionally  . Drug use: No  . Sexual activity: Yes    Partners: Female    Birth control/ protection: Pill   Other Topics Concern  . Not on file   Social History Narrative  . No narrative on file    Allergies:  No Known Allergies  Medications:  Current Outpatient Prescriptions:  .  Cholecalciferol (VITAMIN D3) 3000 UNITS TABS, Take by mouth., Disp: , Rfl:  .  fluticasone (FLONASE) 50 MCG/ACT nasal spray, , Disp: , Rfl:  .  Norethindrone-Ethinyl Estradiol-Fe Biphas (LO LOESTRIN FE) 1 MG-10 MCG / 10 MCG tablet, Take 1 tablet by mouth daily., Disp: , Rfl:  .  tretinoin (RETIN-A) 0.025 % cream, , Disp: , Rfl:   Physical Exam Vitals:BP 102/68   Pulse 66   Ht 5' 7"  (1.702 m)   Wt 66.7 kg (147 lb)   LMP 05/01/2017 (Exact Date)   BMI 23.02 kg/m   General: WF in NAD HEENT: normocephalic, anicteric Neck: no thyroid enlargement, no palpable nodules, no cervical lymphadenopathy  Pulmonary: No increased work of breathing, CTAB Cardiovascular: RRR, without murmur  Breast: Breast symmetrical, no tenderness, no palpable nodules or masses, no skin or nipple retraction present, no nipple discharge.  No axillary, infraclavicular or supraclavicular lymphadenopathy. Abdomen: Soft, non-tender, non-distended.  Umbilicus without lesions.  No hepatomegaly or masses palpable. No evidence of  hernia. Genitourinary:  External: Normal external female genitalia.  Normal urethral meatus, normal  Bartholin's and Skene's glands.    Vagina: Normal vaginal mucosa, no evidence of prolapse.    Cervix: Grossly normal in appearance, small amt of blood at os, non-tender  Uterus: Retroflexed, normal size, shape, and consistency, mobile, and non-tender  Adnexa: No adnexal masses, non-tender  Rectal: deferred  Lymphatic: no evidence of inguinal lymphadenopathy Extremities: no edema, erythema, or tenderness Neurologic: Grossly intact Psychiatric: mood appropriate, affect full     Assessment: 46 y.o. G1P1001 well woman exam  Plan:   1) Breast cancer screening - recommend monthly self breast exam, annual screening mammograms and MRIs of the breast Her next mammogram is already scheduled in [place]. MRI of the breast due after 09/08/2017  2) Cervical cancer screening - Pap was done. ASCCP guidelines and rational discussed.  Patient opts for every 3 years screening interval  3) Contraception -  Continue LoLoestrin  Fe  4) Routine healthcare maintenance labs drawn today: CMP, TSH and free T4 and vitamin D level

## 2017-05-24 LAB — COMPREHENSIVE METABOLIC PANEL
ALT: 13 IU/L (ref 0–32)
AST: 16 IU/L (ref 0–40)
Albumin/Globulin Ratio: 2 (ref 1.2–2.2)
Albumin: 4.4 g/dL (ref 3.5–5.5)
Alkaline Phosphatase: 52 IU/L (ref 39–117)
BUN/Creatinine Ratio: 14 (ref 9–23)
BUN: 12 mg/dL (ref 6–24)
Bilirubin Total: 0.4 mg/dL (ref 0.0–1.2)
CO2: 23 mmol/L (ref 20–29)
Calcium: 9.6 mg/dL (ref 8.7–10.2)
Chloride: 102 mmol/L (ref 96–106)
Creatinine, Ser: 0.88 mg/dL (ref 0.57–1.00)
GFR, EST AFRICAN AMERICAN: 93 mL/min/{1.73_m2} (ref >59)
GFR, EST NON AFRICAN AMERICAN: 81 mL/min/{1.73_m2} (ref >59)
GLOBULIN, TOTAL: 2.2 g/dL (ref 1.5–4.5)
Glucose: 82 mg/dL (ref 65–99)
Potassium: 4.2 mmol/L (ref 3.5–5.2)
SODIUM: 140 mmol/L (ref 134–144)
TOTAL PROTEIN: 6.6 g/dL (ref 6.0–8.5)

## 2017-05-24 LAB — TSH+FREE T4
FREE T4: 1.29 ng/dL (ref 0.82–1.77)
TSH: 2.27 u[IU]/mL (ref 0.450–4.500)

## 2017-05-24 LAB — VITAMIN D 25 HYDROXY (VIT D DEFICIENCY, FRACTURES): VIT D 25 HYDROXY: 90.9 ng/mL (ref 30.0–100.0)

## 2017-05-26 LAB — IGP, APTIMA HPV
HPV Aptima: NEGATIVE
PAP SMEAR COMMENT: 0

## 2017-06-22 ENCOUNTER — Encounter: Payer: Self-pay | Admitting: Certified Nurse Midwife

## 2017-06-22 DIAGNOSIS — Z1231 Encounter for screening mammogram for malignant neoplasm of breast: Secondary | ICD-10-CM | POA: Diagnosis not present

## 2017-06-22 DIAGNOSIS — Z803 Family history of malignant neoplasm of breast: Secondary | ICD-10-CM | POA: Diagnosis not present

## 2017-07-19 ENCOUNTER — Other Ambulatory Visit (INDEPENDENT_AMBULATORY_CARE_PROVIDER_SITE_OTHER): Payer: 59

## 2017-07-19 DIAGNOSIS — Z23 Encounter for immunization: Secondary | ICD-10-CM | POA: Diagnosis not present

## 2017-08-17 DIAGNOSIS — D239 Other benign neoplasm of skin, unspecified: Secondary | ICD-10-CM | POA: Diagnosis not present

## 2017-08-17 DIAGNOSIS — Z1283 Encounter for screening for malignant neoplasm of skin: Secondary | ICD-10-CM | POA: Diagnosis not present

## 2017-08-17 DIAGNOSIS — I8393 Asymptomatic varicose veins of bilateral lower extremities: Secondary | ICD-10-CM | POA: Diagnosis not present

## 2017-08-17 DIAGNOSIS — L811 Chloasma: Secondary | ICD-10-CM | POA: Diagnosis not present

## 2017-08-17 DIAGNOSIS — D485 Neoplasm of uncertain behavior of skin: Secondary | ICD-10-CM | POA: Diagnosis not present

## 2017-08-17 DIAGNOSIS — L812 Freckles: Secondary | ICD-10-CM | POA: Diagnosis not present

## 2017-08-17 DIAGNOSIS — I781 Nevus, non-neoplastic: Secondary | ICD-10-CM | POA: Diagnosis not present

## 2017-08-17 DIAGNOSIS — D229 Melanocytic nevi, unspecified: Secondary | ICD-10-CM | POA: Diagnosis not present

## 2017-10-22 ENCOUNTER — Other Ambulatory Visit: Payer: Self-pay | Admitting: Certified Nurse Midwife

## 2017-10-22 DIAGNOSIS — Z1239 Encounter for other screening for malignant neoplasm of breast: Secondary | ICD-10-CM

## 2017-10-22 DIAGNOSIS — Z9189 Other specified personal risk factors, not elsewhere classified: Secondary | ICD-10-CM

## 2017-10-22 DIAGNOSIS — Z9889 Other specified postprocedural states: Secondary | ICD-10-CM

## 2017-10-24 ENCOUNTER — Other Ambulatory Visit: Payer: Self-pay | Admitting: Certified Nurse Midwife

## 2017-10-24 MED ORDER — AZITHROMYCIN 250 MG PO TABS: ORAL_TABLET | ORAL | 0 refills | Status: DC

## 2017-11-16 ENCOUNTER — Ambulatory Visit
Admission: RE | Admit: 2017-11-16 | Discharge: 2017-11-16 | Disposition: A | Discharge status: Home / Self Care | Payer: 59 | Source: Ambulatory Visit | Attending: Certified Nurse Midwife | Admitting: Certified Nurse Midwife

## 2017-11-16 ENCOUNTER — Other Ambulatory Visit: Payer: 59

## 2017-11-16 DIAGNOSIS — Z803 Family history of malignant neoplasm of breast: Secondary | ICD-10-CM | POA: Diagnosis not present

## 2017-11-16 DIAGNOSIS — Z9889 Other specified postprocedural states: Secondary | ICD-10-CM

## 2017-11-16 DIAGNOSIS — Z1239 Encounter for other screening for malignant neoplasm of breast: Secondary | ICD-10-CM

## 2017-11-16 DIAGNOSIS — Z9189 Other specified personal risk factors, not elsewhere classified: Secondary | ICD-10-CM

## 2017-11-16 MED ORDER — GADOBENATE DIMEGLUMINE 529 MG/ML IV SOLN
14.0000 mL | Freq: Once | INTRAVENOUS | Status: AC | PRN
  Administered 2017-11-16: 14 mL via INTRAVENOUS

## 2018-05-29 ENCOUNTER — Ambulatory Visit (INDEPENDENT_AMBULATORY_CARE_PROVIDER_SITE_OTHER): Payer: 59 | Admitting: Certified Nurse Midwife

## 2018-05-29 ENCOUNTER — Encounter: Payer: Self-pay | Admitting: Certified Nurse Midwife

## 2018-05-29 VITALS — BP 100/70 | HR 78 | Ht 67.0 in | Wt 149.0 lb

## 2018-05-29 DIAGNOSIS — Z13220 Encounter for screening for lipoid disorders: Secondary | ICD-10-CM

## 2018-05-29 DIAGNOSIS — Z803 Family history of malignant neoplasm of breast: Secondary | ICD-10-CM | POA: Diagnosis not present

## 2018-05-29 DIAGNOSIS — Z1329 Encounter for screening for other suspected endocrine disorder: Secondary | ICD-10-CM | POA: Diagnosis not present

## 2018-05-29 DIAGNOSIS — Z1321 Encounter for screening for nutritional disorder: Secondary | ICD-10-CM | POA: Diagnosis not present

## 2018-05-29 DIAGNOSIS — Z01419 Encounter for gynecological examination (general) (routine) without abnormal findings: Secondary | ICD-10-CM

## 2018-05-29 DIAGNOSIS — Z1239 Encounter for other screening for malignant neoplasm of breast: Secondary | ICD-10-CM

## 2018-05-29 DIAGNOSIS — Z1231 Encounter for screening mammogram for malignant neoplasm of breast: Secondary | ICD-10-CM | POA: Diagnosis not present

## 2018-05-29 NOTE — Progress Notes (Signed)
Gynecology Annual Exam  PCP: Dalia Heading, CNM  Chief Complaint:  Chief Complaint  Patient presents with  . Gynecologic Exam    History of Present Illness:Cynthia Haas is a 47 year old Caucasian/White female , G 1 P 1 0 0 1 , who presents for her annual exam . She is having no significant gyn concerns. She switched from Lo Loestrin to Nuvaring due to break through bleeding which has resolved. She uses her Nuvaring continuously and occasionally pauses for a withdrawal bleed. Her menses last 7 days and are light to moderate flow.  She denies dysmenorrhea.  The patient's past medical history is notable for a family history of breast cancer of her mother at age 18 and her MGGMs hx of breast cancer in her 75s.Cynthia Haas has had negative MyRISK genetic testing, but her lifetime risk of breast cancer using the TC model is still 29.2%. She has been getting annual mammograms and MRIs as recommended. She had a right breast biopsy in 2014 for an abnormal MRI finding which was benign.  Since her last annual GYN exam dated 05/23/2017 , she has had no significant changes in her health.  She is sexually active. She is currently using birth control pills for contraception.  Her most recent pap smear was obtained 05/23/2017 and was with negative cells and negative HPV DNA.  Her most recent mammogram obtained on 06/22/2017 was negative. An MRI 11/16/2017 also was negative for any significant changes (BIRADS 2). Her next 3D mammogram is scheduled for next month.  There is a positive history of breast cancer in her mother. Genetic testing has been done. The patient tested negative for BRCA 1, negative for BRCA 2, and negative for BART( actually had a negative MYRISK).  There is no family history of ovarian cancer.  The patient does do monthly self breast exams.  The patient does not smoke.  The patient does drink occasionally.  The patient does not use illegal drugs.  The patient exercises regularly  (cardio 2x/wk and strength training 2x/wk)  The patient does get adequate calcium in her diet.  She had a recent cholesterol screen in 2017 that was normal.  The patient denies current symptoms of depression.    Review of Systems: Review of Systems  Constitutional: Positive for malaise/fatigue. Negative for chills, fever and weight loss.  HENT: Negative for congestion, sinus pain and sore throat.   Eyes: Negative for blurred vision and pain.  Respiratory: Negative for hemoptysis, shortness of breath and wheezing.   Cardiovascular: Negative for chest pain, palpitations and leg swelling.  Gastrointestinal: Negative for abdominal pain, blood in stool, diarrhea, heartburn, nausea and vomiting.  Genitourinary: Negative for dysuria, frequency, hematuria and urgency.  Musculoskeletal: Negative for back pain, joint pain and myalgias.  Skin: Negative for itching and rash.  Neurological: Negative for dizziness, tingling and headaches.  Endo/Heme/Allergies: Negative for environmental allergies and polydipsia. Does not bruise/bleed easily.       Negative for hirsutism   Psychiatric/Behavioral: Negative for depression. The patient is not nervous/anxious and does not have insomnia.     Past Medical History:  Past Medical History:  Diagnosis Date  . Acne   . Family history of breast cancer    mom was BRCA 1/2 neg. Patient is MYRISK negative. Lifetime risk 29.2%  . Genetic testing of female 06/2013   My Risk neg  . History of mammogram 05/01/2012   BIRADS1 @ [facility] 11/25/15; BIRAD2 cAT 2 BREAST mri 10/16; [facility]  06/06/16 [facility] MRI 11/3 BENIGN  . History of Papanicolaou smear of cervix 03/22/2012   neg  . Increased risk of breast cancer 2014   last mammogram 06/22/17 neg; last breast MRI-11/16/2017  . Irritable bowel disease   . Seasonal allergies     Past Surgical History:  Past Surgical History:  Procedure Laterality Date  . BREAST BIOPSY  [id number]  . BREAST BIOPSY Right 06/19/2013   Procedure:   RIGHT BREAST EXCISION WITH NEEDLE LOCALIZATION;  Surgeon: Rolm Bookbinder, MD;  Location: [facility];  Service: General;  Laterality: Right;  . IR SCLEROTHERAPY OF A FLUID COLLECTION  2016  . MRI    . WISDOM TOOTH EXTRACTION      Family History:  Family History  Problem Relation Age of Onset  . Cancer Mother 58       breast, recurrence at age 54  . Hyperlipidemia Mother   . Thyroid disease Mother        hypothyroidism  . Hypertension Father   . Thyroid disease Maternal Grandmother   . Heart disease Maternal Grandfather   . Hypertension Maternal Grandfather   . Prostate cancer Maternal Grandfather 50  . Hypertension Paternal Grandmother   . Breast cancer Other 65    Social History:  Social History   Socioeconomic History  . Marital status: Married    Spouse name: Corene Cornea  . Number of children: 1  . Years of education: 23  . Highest education level: Not on file  Occupational History  . Occupation: Librarian, academic  Social Needs  . Financial resource strain: Not on file  . Food insecurity:    Worry: Not on file    Inability: Not on file  . Transportation needs:    Medical: Not on file    Non-medical: Not on file  Tobacco Use  . Smoking status: Never Smoker  . Smokeless tobacco: Never Used  Substance and Sexual Activity  . Alcohol use: Yes    Comment: occasionally  . Drug use: No  . Sexual activity: Yes    Partners: Female    Birth control/protection: Inserts  Lifestyle  . Physical activity:    Days per week: Not on file    Minutes per session: Not on file  . Stress: Not on file  Relationships  . Social connections:    Talks on phone: Not on file    Gets together: Not on file    Attends religious service: Not on file    Active member of club or organization: Not on file    Attends meetings of clubs or organizations: Not on file    Relationship status: Not on file  . Intimate partner violence:    Fear of current or ex partner: Not on file     Emotionally abused: Not on file    Physically abused: Not on file    Forced sexual activity: Not on file  Other Topics Concern  . Not on file  Social History Narrative  . Not on file    Allergies:  No Known Allergies  Medications:  Current Outpatient Medications:  .  Cholecalciferol (VITAMIN D3) 3000 UNITS TABS, Take by mouth., Disp: , Rfl:  .  etonogestrel-ethinyl estradiol (NUVARING) 0.12-0.015 MG/24HR vaginal ring, Place 1 Device vaginally continuous., Disp: , Rfl:  .  fluticasone (FLONASE) 50 MCG/ACT nasal spray, , Disp: , Rfl:  .  tretinoin (RETIN-A) 0.025 % cream, , Disp: , Rfl:   Physical Exam Vitals:BP 100/70   Pulse 78  Ht 5' 7"  (1.702 m)   Wt 149 lb (67.6 kg)   LMP 04/23/2018   BMI 23.34 kg/m   General: WF in NAD HEENT: normocephalic, anicteric Neck: no thyroid enlargement, no palpable nodules, no cervical lymphadenopathy  Pulmonary: No increased work of breathing, CTAB Cardiovascular: RRR, without murmur  Breast: Breast symmetrical, no tenderness, no palpable nodules or masses, no skin or nipple retraction present, no nipple discharge.  No axillary, infraclavicular or supraclavicular lymphadenopathy. Abdomen: Soft, non-tender, non-distended.  Umbilicus without lesions.  No hepatomegaly or masses palpable. No evidence of hernia. Genitourinary:  External: Normal external female genitalia.  Normal urethral meatus, normal  Bartholin's and Skene's glands.    Vagina: Normal vaginal mucosa, no evidence of prolapse, Nuvaring in place    Cervix: Grossly normal in appearance, non-tender  Uterus: Retroflexed, normal size, shape, and consistency, mobile, and non-tender  Adnexa: No adnexal masses, non-tender  Rectal: deferred  Lymphatic: no evidence of inguinal lymphadenopathy Extremities: no edema, erythema, or tenderness Neurologic: Grossly intact Psychiatric: mood appropriate, affect full     Assessment: 47 y.o. G1P1001 well woman exam Family history of breast  cancer with increased lifetime risk of 29.2%.  Plan:   1) Breast cancer screening - recommend monthly self breast exam, annual screening mammograms and MRIs of the breast. Her next mammogram is already scheduled in [place] next month.  MRI due in Jan 2020  2) Cervical cancer screening - Pap was not done. ASCCP guidelines and rational discussed.  Patient opts for every 3 years screening interval  3) Contraception -  Continue Nuvaring  4) Routine healthcare maintenance labs drawn today: lipid panel, TSH and free T4 and vitamin D level  5) RTO 1 year and prn.  Dalia Heading, CNM

## 2018-06-05 ENCOUNTER — Other Ambulatory Visit: Payer: 59

## 2018-06-05 DIAGNOSIS — Z1321 Encounter for screening for nutritional disorder: Secondary | ICD-10-CM | POA: Diagnosis not present

## 2018-06-05 DIAGNOSIS — Z13220 Encounter for screening for lipoid disorders: Secondary | ICD-10-CM | POA: Diagnosis not present

## 2018-06-05 DIAGNOSIS — Z1329 Encounter for screening for other suspected endocrine disorder: Secondary | ICD-10-CM | POA: Diagnosis not present

## 2018-06-06 LAB — LIPID PANEL WITH LDL/HDL RATIO
Cholesterol, Total: 191 mg/dL (ref 100–199)
HDL: 74 mg/dL (ref >39)
LDL Calculated: 97 mg/dL (ref 0–99)
LDL/HDL RATIO: 1.3 ratio (ref 0.0–3.2)
Triglycerides: 102 mg/dL (ref 0–149)
VLDL Cholesterol Cal: 20 mg/dL (ref 5–40)

## 2018-06-06 LAB — COMPREHENSIVE METABOLIC PANEL
ALBUMIN: 4.2 g/dL (ref 3.5–5.5)
ALK PHOS: 51 IU/L (ref 39–117)
ALT: 10 IU/L (ref 0–32)
AST: 16 IU/L (ref 0–40)
Albumin/Globulin Ratio: 1.8 (ref 1.2–2.2)
BUN / CREAT RATIO: 12 (ref 9–23)
BUN: 12 mg/dL (ref 6–24)
Bilirubin Total: 0.4 mg/dL (ref 0.0–1.2)
CO2: 21 mmol/L (ref 20–29)
Calcium: 9.4 mg/dL (ref 8.7–10.2)
Chloride: 103 mmol/L (ref 96–106)
Creatinine, Ser: 0.98 mg/dL (ref 0.57–1.00)
GFR calc Af Amer: 81 mL/min/{1.73_m2} (ref >59)
GFR calc non Af Amer: 70 mL/min/{1.73_m2} (ref >59)
GLUCOSE: 87 mg/dL (ref 65–99)
Globulin, Total: 2.3 g/dL (ref 1.5–4.5)
Potassium: 4 mmol/L (ref 3.5–5.2)
SODIUM: 139 mmol/L (ref 134–144)
Total Protein: 6.5 g/dL (ref 6.0–8.5)

## 2018-06-06 LAB — TSH+FREE T4
Free T4: 1.19 ng/dL (ref 0.82–1.77)
TSH: 5.37 u[IU]/mL — AB (ref 0.450–4.500)

## 2018-06-06 LAB — VITAMIN D 25 HYDROXY (VIT D DEFICIENCY, FRACTURES): Vit D, 25-Hydroxy: 117 ng/mL — ABNORMAL HIGH (ref 30.0–100.0)

## 2018-06-28 ENCOUNTER — Encounter: Payer: Self-pay | Admitting: Certified Nurse Midwife

## 2018-06-28 DIAGNOSIS — Z1231 Encounter for screening mammogram for malignant neoplasm of breast: Secondary | ICD-10-CM | POA: Diagnosis not present

## 2018-06-28 DIAGNOSIS — Z803 Family history of malignant neoplasm of breast: Secondary | ICD-10-CM | POA: Diagnosis not present

## 2018-07-01 ENCOUNTER — Encounter: Payer: Self-pay | Admitting: Certified Nurse Midwife

## 2018-07-01 DIAGNOSIS — N6311 Unspecified lump in the right breast, upper outer quadrant: Secondary | ICD-10-CM | POA: Diagnosis not present

## 2018-07-01 DIAGNOSIS — N6312 Unspecified lump in the right breast, upper inner quadrant: Secondary | ICD-10-CM | POA: Diagnosis not present

## 2018-07-01 DIAGNOSIS — R922 Inconclusive mammogram: Secondary | ICD-10-CM | POA: Diagnosis not present

## 2018-07-30 ENCOUNTER — Other Ambulatory Visit: Payer: 59

## 2018-07-30 ENCOUNTER — Other Ambulatory Visit: Payer: Self-pay | Admitting: Certified Nurse Midwife

## 2018-07-30 DIAGNOSIS — R7989 Other specified abnormal findings of blood chemistry: Secondary | ICD-10-CM

## 2018-07-30 NOTE — Progress Notes (Unsigned)
tsh

## 2018-08-05 ENCOUNTER — Ambulatory Visit (INDEPENDENT_AMBULATORY_CARE_PROVIDER_SITE_OTHER): Payer: 59

## 2018-08-05 DIAGNOSIS — Z23 Encounter for immunization: Secondary | ICD-10-CM

## 2018-08-06 DIAGNOSIS — R7989 Other specified abnormal findings of blood chemistry: Secondary | ICD-10-CM | POA: Diagnosis not present

## 2018-08-07 LAB — TSH+FREE T4
FREE T4: 1.38 ng/dL (ref 0.82–1.77)
TSH: 2.29 u[IU]/mL (ref 0.450–4.500)

## 2018-08-07 NOTE — Progress Notes (Signed)
Phebe Colla! Thx

## 2018-10-09 ENCOUNTER — Other Ambulatory Visit: Payer: Self-pay | Admitting: Certified Nurse Midwife

## 2018-10-09 MED ORDER — HYDROQUINONE 4 % EX CREA: TOPICAL_CREAM | Freq: Every day | CUTANEOUS | 2 refills | Status: DC

## 2018-10-10 ENCOUNTER — Other Ambulatory Visit: Payer: Self-pay | Admitting: Certified Nurse Midwife

## 2018-10-10 MED ORDER — HYDROQUINONE 4 % EX CREA: TOPICAL_CREAM | Freq: Every day | CUTANEOUS | 2 refills | Status: DC

## 2018-10-21 ENCOUNTER — Other Ambulatory Visit: Payer: Self-pay | Admitting: Certified Nurse Midwife

## 2018-10-21 MED ORDER — CIPROFLOXACIN HCL 500 MG PO TABS: 500.0000 mg | ORAL_TABLET | Freq: Two times a day (BID) | ORAL | 2 refills | Status: DC

## 2018-10-21 MED ORDER — AZITHROMYCIN 250 MG PO TABS: ORAL_TABLET | ORAL | 0 refills | Status: DC

## 2018-10-21 NOTE — Progress Notes (Signed)
Going on mission trip to [place]. Requested medications in case of emergency.Dalia Heading, CNM

## 2018-11-11 ENCOUNTER — Other Ambulatory Visit: Payer: Self-pay | Admitting: Certified Nurse Midwife

## 2018-11-11 ENCOUNTER — Telehealth: Payer: Self-pay

## 2018-11-11 DIAGNOSIS — Z1231 Encounter for screening mammogram for malignant neoplasm of breast: Secondary | ICD-10-CM

## 2018-11-11 NOTE — Telephone Encounter (Signed)
Pt would like and order sent to [facility] for a screening breast MRI for increased risk of breast cancer.

## 2018-11-11 NOTE — Telephone Encounter (Signed)
Order in. Please talk to [facility] about scheduling

## 2018-11-18 ENCOUNTER — Other Ambulatory Visit: Payer: Self-pay | Admitting: Certified Nurse Midwife

## 2018-11-18 DIAGNOSIS — Z1239 Encounter for other screening for malignant neoplasm of breast: Secondary | ICD-10-CM

## 2018-11-18 DIAGNOSIS — Z1231 Encounter for screening mammogram for malignant neoplasm of breast: Secondary | ICD-10-CM

## 2018-11-18 DIAGNOSIS — Z803 Family history of malignant neoplasm of breast: Secondary | ICD-10-CM

## 2018-11-27 ENCOUNTER — Other Ambulatory Visit: Payer: Self-pay | Admitting: Certified Nurse Midwife

## 2018-11-29 ENCOUNTER — Ambulatory Visit
Admission: RE | Admit: 2018-11-29 | Discharge: 2018-11-29 | Disposition: A | Discharge status: Home / Self Care | Payer: 59 | Source: Ambulatory Visit | Attending: Certified Nurse Midwife | Admitting: Certified Nurse Midwife

## 2018-11-29 DIAGNOSIS — Z1239 Encounter for other screening for malignant neoplasm of breast: Secondary | ICD-10-CM | POA: Diagnosis not present

## 2018-11-29 DIAGNOSIS — Z803 Family history of malignant neoplasm of breast: Secondary | ICD-10-CM | POA: Insufficient documentation

## 2018-11-29 DIAGNOSIS — N6489 Other specified disorders of breast: Secondary | ICD-10-CM | POA: Diagnosis not present

## 2018-11-29 DIAGNOSIS — Z853 Personal history of malignant neoplasm of breast: Secondary | ICD-10-CM | POA: Diagnosis not present

## 2018-11-29 MED ORDER — GADOBUTROL 1 MMOL/ML IV SOLN
6.0000 mL | Freq: Once | INTRAVENOUS | Status: AC | PRN
  Administered 2018-11-29: 6 mL via INTRAVENOUS

## 2018-12-06 ENCOUNTER — Other Ambulatory Visit: Payer: 59

## 2019-06-03 NOTE — Progress Notes (Addendum)
Gynecology Annual Exam  PCP: Dalia Heading, CNM  Chief Complaint:  Chief Complaint  Patient presents with  . Gynecologic Exam    History of Present Illness:Cynthia Haas is a 48 year old Caucasian/White female , G 1 P 1 0 0 1 , who presents for her annual exam . She is having no significant gyn concerns. She switched from Nuvaring to Lo Loestrin this past month and experienced some break though bleeding. She was using her Nuvaring continuously and occasionally pausing for a withdrawal bleed. Her menses would last 7 days and were light to moderate flow.  She denies dysmenorrhea.  The patient's past medical history is notable for a family history of breast cancer of her mother at age 48 and her MGGMs hx of breast cancer in her 48s.[name] has had negative MyRISK genetic testing, but her lifetime risk of breast cancer using the TC model is still 29.2%. She has been getting annual mammograms and MRIs as recommended. She had a right breast biopsy in 2014 for an abnormal MRI finding which was benign.  Since her last annual GYN exam dated 05/29/2018 , she has had no significant changes in her health.  She is sexually active. She is currently using birth control pills for contraception.  Her most recent pap smear was obtained 05/23/2017 and was with negative cells and negative HPV DNA.  Her most recent mammogram obtained on 06/28/2018 was Birads 0, but after additional views on the right breast, was Birads1.Marland Kitchen An MRI 11/29/2018 also was negative (BIRADS 1). Her next 3D mammogram is scheduled for next month.  There is a positive history of breast cancer in her mother. Genetic testing has been done. The patient tested negative for BRCA 1, negative for BRCA 2, and negative for BART( actually had a negative MYRISK).  There is no family history of ovarian cancer.  The patient does do monthly self breast exams.  The patient does not smoke.  The patient does drink occasionally.  The patient  does not use illegal drugs.  The patient exercises regularly (cardio 2x/wk and strength training 2x/wk)  The patient does get adequate calcium in her diet.  She had a recent cholesterol screen in 2019 that was normal.  The patient denies current symptoms of depression.    Review of Systems: Review of Systems  Constitutional: Negative for chills, fever, malaise/fatigue and weight loss.  HENT: Negative for congestion, sinus pain and sore throat.   Eyes: Negative for blurred vision and pain.  Respiratory: Negative for hemoptysis, shortness of breath and wheezing.   Cardiovascular: Negative for chest pain, palpitations and leg swelling.  Gastrointestinal: Negative for abdominal pain, blood in stool, diarrhea, heartburn, nausea and vomiting.  Genitourinary: Negative for dysuria, frequency, hematuria and urgency.  Musculoskeletal: Negative for back pain, joint pain and myalgias.  Skin: Negative for itching and rash.  Neurological: Negative for dizziness, tingling and headaches.  Endo/Heme/Allergies: Negative for environmental allergies and polydipsia. Does not bruise/bleed easily.       Negative for hirsutism   Psychiatric/Behavioral: Negative for depression. The patient is not nervous/anxious and does not have insomnia.     Past Medical History:  Past Medical History:  Diagnosis Date  . Acne   . Family history of breast cancer    mom was BRCA 1/2 neg. Patient is MYRISK negative. Lifetime risk 29.2%  . Genetic testing of female 06/2013   My Risk neg  . History of mammogram 05/01/2012   BIRADS1 @  [facility] 11/25/15; BIRAD2 cAT 2 BREAST mri 10/16; BIRAD2 06/06/16 [facility] MRI 11/3 BENIGN  . History of Papanicolaou smear of cervix 03/22/2012   neg  . Increased risk of breast cancer 2014   last mammogram 06/22/17 neg; last breast MRI-11/16/2017  . Irritable bowel disease   . Seasonal allergies     Past Surgical History:  Past Surgical History:  Procedure Laterality Date  . BREAST BIOPSY  [id number]   . BREAST BIOPSY Right 06/19/2013   Procedure:  RIGHT BREAST EXCISION WITH NEEDLE LOCALIZATION;  Surgeon: Rolm Bookbinder, MD;  Location: [place];  Service: General;  Laterality: Right;  . IR SCLEROTHERAPY OF A FLUID COLLECTION  2016  . MRI    . WISDOM TOOTH EXTRACTION      Family History:  Family History  Problem Relation Age of Onset  . Cancer Mother 49       breast, recurrence at age 33  . Hyperlipidemia Mother   . Thyroid disease Mother        hypothyroidism  . Hypertension Father   . Thyroid disease Maternal Grandmother   . Heart disease Maternal Grandfather   . Hypertension Maternal Grandfather   . Prostate cancer Maternal Grandfather 61  . Hypertension Paternal Grandmother   . Breast cancer Other 72    Social History:  Social History   Socioeconomic History  . Marital status: Married    Spouse name: Corene Cornea  . Number of children: 1  . Years of education: 64  . Highest education level: Not on file  Occupational History  . Occupation: Librarian, academic  Social Needs  . Financial resource strain: Not on file  . Food insecurity    Worry: Not on file    Inability: Not on file  . Transportation needs    Medical: Not on file    Non-medical: Not on file  Tobacco Use  . Smoking status: Never Smoker  . Smokeless tobacco: Never Used  Substance and Sexual Activity  . Alcohol use: Yes    Comment: occasionally  . Drug use: No  . Sexual activity: Yes    Partners: Female    Birth control/protection: Pill  Lifestyle  . Physical activity    Days per week: Not on file    Minutes per session: Not on file  . Stress: Not on file  Relationships  . Social Herbalist on phone: Not on file    Gets together: Not on file    Attends religious service: Not on file    Active member of club or organization: Not on file    Attends meetings of clubs or organizations: Not on file    Relationship status: Not on file  . Intimate partner violence     Fear of current or ex partner: Not on file    Emotionally abused: Not on file    Physically abused: Not on file    Forced sexual activity: Not on file  Other Topics Concern  . Not on file  Social History Narrative  . Not on file    Allergies:  No Known Allergies  Medications:  Current Outpatient Medications on File Prior to Visit  Medication Sig Dispense Refill  . Cholecalciferol (VITAMIN D3) 3000 UNITS TABS Take by mouth.    . fluticasone (FLONASE) 50 MCG/ACT nasal spray     . hydroquinone 4 % cream Apply topically daily. 28.35 g 2  . Norethindrone-Ethinyl Estradiol-Fe Biphas (LO LOESTRIN FE) 1 MG-10 MCG / 10 MCG  tablet      No current facility-administered medications on file prior to visit.      Physical Exam Vitals:BP 120/80   Pulse (!) 57   Ht 5' 7"  (1.702 m)   Wt 151 lb (68.5 kg)   LMP 05/29/2019 (Exact Date)   BMI 23.65 kg/m  General: WF in NAD HEENT: normocephalic, anicteric Neck: no thyroid enlargement, no palpable nodules Pulmonary: No increased work of breathing, CTAB Cardiovascular: RRR, without murmur  Breast: Breast symmetrical, no tenderness, no palpable nodules or masses, no skin or nipple retraction present, no nipple discharge.  No axillary, infraclavicular or supraclavicular lymphadenopathy. Abdomen: Soft, non-tender, non-distended.  Umbilicus without lesions.  No hepatomegaly or masses palpable. No evidence of hernia. Genitourinary:  External: Normal external female genitalia.  Normal urethral meatus, normal Bartholin's and Skene's glands.    Vagina: Normal vaginal mucosa, no evidence of prolapse, blood present  Cervix: anterior, non-tender  Uterus: Retroflexed, normal size, shape, and consistency, mobile, and non-tender  Adnexa: No adnexal masses, non-tender  Rectal: deferred  Lymphatic: no evidence of inguinal lymphadenopathy Extremities: no edema, erythema, or tenderness Neurologic: Grossly intact Psychiatric: mood appropriate, affect full      Assessment: 48 y.o. G1P1001 well woman exam Family history of breast cancer with increased lifetime risk of 29.2%.  Plan:   1) Breast cancer screening - recommend monthly self breast exam, annual screening mammograms and MRIs of the breast. Her next mammogram is already scheduled in [place] next month.  MRI due in Jan 2021  2) Cervical cancer screening - Pap was not done. ASCCP guidelines and rational discussed.  Patient opts for every 3 years screening interval. Next due next year  3) Contraception - continue  LoLoestrin  4) Routine healthcare maintenance labs drawn today:  TSH, CMP and vitamin D level  5) Colon cancer screening: Cologuard ordered per patient request.  5) RTO 1 year and prn.  Dalia Heading, CNM

## 2019-06-04 ENCOUNTER — Encounter: Payer: Self-pay | Admitting: Certified Nurse Midwife

## 2019-06-04 ENCOUNTER — Other Ambulatory Visit: Payer: Self-pay

## 2019-06-04 ENCOUNTER — Ambulatory Visit (INDEPENDENT_AMBULATORY_CARE_PROVIDER_SITE_OTHER): Payer: 59 | Admitting: Certified Nurse Midwife

## 2019-06-04 ENCOUNTER — Other Ambulatory Visit: Payer: 59

## 2019-06-04 VITALS — BP 120/80 | HR 57 | Ht 67.0 in | Wt 151.0 lb

## 2019-06-04 DIAGNOSIS — Z01419 Encounter for gynecological examination (general) (routine) without abnormal findings: Secondary | ICD-10-CM

## 2019-06-04 DIAGNOSIS — Z8349 Family history of other endocrine, nutritional and metabolic diseases: Secondary | ICD-10-CM

## 2019-06-04 DIAGNOSIS — Z9189 Other specified personal risk factors, not elsewhere classified: Secondary | ICD-10-CM

## 2019-06-04 DIAGNOSIS — Z803 Family history of malignant neoplasm of breast: Secondary | ICD-10-CM

## 2019-06-04 DIAGNOSIS — Z1239 Encounter for other screening for malignant neoplasm of breast: Secondary | ICD-10-CM

## 2019-06-04 DIAGNOSIS — Z1211 Encounter for screening for malignant neoplasm of colon: Secondary | ICD-10-CM

## 2019-06-05 LAB — CMP14+EGFR
ALT: 13 IU/L (ref 0–32)
AST: 19 IU/L (ref 0–40)
Albumin/Globulin Ratio: 2.3 — ABNORMAL HIGH (ref 1.2–2.2)
Albumin: 4.5 g/dL (ref 3.8–4.8)
Alkaline Phosphatase: 53 IU/L (ref 39–117)
BUN/Creatinine Ratio: 11 (ref 9–23)
BUN: 10 mg/dL (ref 6–24)
Bilirubin Total: 0.4 mg/dL (ref 0.0–1.2)
CO2: 22 mmol/L (ref 20–29)
Calcium: 9.5 mg/dL (ref 8.7–10.2)
Chloride: 101 mmol/L (ref 96–106)
Creatinine, Ser: 0.89 mg/dL (ref 0.57–1.00)
GFR calc Af Amer: 91 mL/min/{1.73_m2} (ref >59)
GFR calc non Af Amer: 79 mL/min/{1.73_m2} (ref >59)
Globulin, Total: 2 g/dL (ref 1.5–4.5)
Glucose: 84 mg/dL (ref 65–99)
Potassium: 4.4 mmol/L (ref 3.5–5.2)
Sodium: 139 mmol/L (ref 134–144)
Total Protein: 6.5 g/dL (ref 6.0–8.5)

## 2019-06-05 LAB — TSH: TSH: 2.43 u[IU]/mL (ref 0.450–4.500)

## 2019-06-05 LAB — VITAMIN D 25 HYDROXY (VIT D DEFICIENCY, FRACTURES): Vit D, 25-Hydroxy: 59.5 ng/mL (ref 30.0–100.0)

## 2019-06-25 DIAGNOSIS — Z1211 Encounter for screening for malignant neoplasm of colon: Secondary | ICD-10-CM | POA: Diagnosis not present

## 2019-06-30 ENCOUNTER — Other Ambulatory Visit: Payer: Self-pay | Admitting: Certified Nurse Midwife

## 2019-06-30 MED ORDER — METRONIDAZOLE 500 MG PO TABS: 250.0000 mg | ORAL_TABLET | Freq: Two times a day (BID) | ORAL | 0 refills | Status: DC

## 2019-07-03 LAB — COLOGUARD

## 2019-07-04 ENCOUNTER — Encounter: Payer: Self-pay | Admitting: Certified Nurse Midwife

## 2019-07-04 DIAGNOSIS — Z803 Family history of malignant neoplasm of breast: Secondary | ICD-10-CM | POA: Diagnosis not present

## 2019-07-04 DIAGNOSIS — Z1231 Encounter for screening mammogram for malignant neoplasm of breast: Secondary | ICD-10-CM | POA: Diagnosis not present

## 2019-07-08 ENCOUNTER — Other Ambulatory Visit: Payer: Self-pay | Admitting: Certified Nurse Midwife

## 2019-07-08 MED ORDER — METRONIDAZOLE 500 MG PO TABS: 500.0000 mg | ORAL_TABLET | Freq: Two times a day (BID) | ORAL | 0 refills | Status: AC

## 2019-10-08 ENCOUNTER — Other Ambulatory Visit: Payer: Self-pay | Admitting: Certified Nurse Midwife

## 2019-10-08 DIAGNOSIS — Z9189 Other specified personal risk factors, not elsewhere classified: Secondary | ICD-10-CM

## 2019-10-08 DIAGNOSIS — Z1231 Encounter for screening mammogram for malignant neoplasm of breast: Secondary | ICD-10-CM

## 2019-10-08 DIAGNOSIS — Z803 Family history of malignant neoplasm of breast: Secondary | ICD-10-CM

## 2019-10-10 DIAGNOSIS — Z135 Encounter for screening for eye and ear disorders: Secondary | ICD-10-CM | POA: Diagnosis not present

## 2019-10-10 DIAGNOSIS — H5203 Hypermetropia, bilateral: Secondary | ICD-10-CM | POA: Diagnosis not present

## 2019-10-10 DIAGNOSIS — H524 Presbyopia: Secondary | ICD-10-CM | POA: Diagnosis not present

## 2019-10-10 DIAGNOSIS — H52223 Regular astigmatism, bilateral: Secondary | ICD-10-CM | POA: Diagnosis not present

## 2019-10-15 ENCOUNTER — Other Ambulatory Visit: Payer: Self-pay | Admitting: Certified Nurse Midwife

## 2019-10-15 DIAGNOSIS — Z803 Family history of malignant neoplasm of breast: Secondary | ICD-10-CM

## 2019-10-15 DIAGNOSIS — Z1231 Encounter for screening mammogram for malignant neoplasm of breast: Secondary | ICD-10-CM

## 2019-10-15 DIAGNOSIS — Z9189 Other specified personal risk factors, not elsewhere classified: Secondary | ICD-10-CM

## 2019-12-01 ENCOUNTER — Other Ambulatory Visit: Payer: Self-pay

## 2019-12-01 ENCOUNTER — Ambulatory Visit
Admission: RE | Admit: 2019-12-01 | Discharge: 2019-12-01 | Disposition: A | Discharge status: Home / Self Care | Payer: 59 | Source: Ambulatory Visit | Attending: Certified Nurse Midwife | Admitting: Certified Nurse Midwife

## 2019-12-01 DIAGNOSIS — Z803 Family history of malignant neoplasm of breast: Secondary | ICD-10-CM | POA: Diagnosis not present

## 2019-12-01 DIAGNOSIS — N6489 Other specified disorders of breast: Secondary | ICD-10-CM | POA: Diagnosis not present

## 2019-12-01 DIAGNOSIS — Z1231 Encounter for screening mammogram for malignant neoplasm of breast: Secondary | ICD-10-CM

## 2019-12-01 MED ORDER — GADOBUTROL 1 MMOL/ML IV SOLN
7.0000 mL | Freq: Once | INTRAVENOUS | Status: AC | PRN
  Administered 2019-12-01: 7 mL via INTRAVENOUS

## 2020-04-20 ENCOUNTER — Other Ambulatory Visit: Payer: Self-pay | Admitting: Certified Nurse Midwife

## 2020-04-20 DIAGNOSIS — Z1152 Encounter for screening for COVID-19: Secondary | ICD-10-CM

## 2020-04-22 ENCOUNTER — Other Ambulatory Visit: Payer: Self-pay

## 2020-04-22 ENCOUNTER — Other Ambulatory Visit: Payer: 59

## 2020-04-22 DIAGNOSIS — Z1152 Encounter for screening for COVID-19: Secondary | ICD-10-CM

## 2020-04-23 LAB — SARS-COV-2 SEMI-QUANTITATIVE TOTAL ANTIBODY, SPIKE: SARS-CoV-2 Semi-Quant Total Ab: <0.4 U/mL (ref <0.8)

## 2020-06-17 ENCOUNTER — Other Ambulatory Visit (HOSPITAL_COMMUNITY)
Admission: RE | Admit: 2020-06-17 | Discharge: 2020-06-17 | Disposition: A | Discharge status: Home / Self Care | Payer: 59 | Source: Ambulatory Visit | Attending: Certified Nurse Midwife | Admitting: Certified Nurse Midwife

## 2020-06-17 ENCOUNTER — Ambulatory Visit: Payer: 59 | Admitting: Certified Nurse Midwife

## 2020-06-17 ENCOUNTER — Other Ambulatory Visit: Payer: Self-pay

## 2020-06-17 ENCOUNTER — Encounter: Payer: Self-pay | Admitting: Certified Nurse Midwife

## 2020-06-17 VITALS — BP 115/89 | HR 79 | Ht 67.0 in | Wt 152.0 lb

## 2020-06-17 DIAGNOSIS — Z0000 Encounter for general adult medical examination without abnormal findings: Secondary | ICD-10-CM | POA: Diagnosis not present

## 2020-06-17 DIAGNOSIS — Z131 Encounter for screening for diabetes mellitus: Secondary | ICD-10-CM | POA: Diagnosis not present

## 2020-06-17 DIAGNOSIS — Z01419 Encounter for gynecological examination (general) (routine) without abnormal findings: Secondary | ICD-10-CM

## 2020-06-17 DIAGNOSIS — Z124 Encounter for screening for malignant neoplasm of cervix: Secondary | ICD-10-CM

## 2020-06-17 DIAGNOSIS — Z3041 Encounter for surveillance of contraceptive pills: Secondary | ICD-10-CM

## 2020-06-17 DIAGNOSIS — Z13220 Encounter for screening for lipoid disorders: Secondary | ICD-10-CM | POA: Diagnosis not present

## 2020-06-17 NOTE — Progress Notes (Signed)
Gynecology Annual Exam  PCP: Cynthia Haas, CNM  Chief Complaint:  Chief Complaint  Patient presents with  . Gynecologic Exam    bld work    History of Present Illness:Cynthia Haas is a 49 year old Caucasian/White female , G 1 P 1 0 0 1 , who presents for her annual exam and routine labs. She is having no significant gyn concerns. She was taking Lo Loestrin continuously this past year and experienced some break though bleeding. She just switched to [facility] four days ago and plans on taking the pills continuously.   The patient's past medical history is notable for a family history of breast cancer of her mother at age 62 and her MGGMs hx of breast cancer in her 11s.Cynthia Haas has had negative MyRISK genetic testing, but her lifetime risk of breast cancer using the TC model is still 29.2%. She has been getting annual mammograms and MRIs as recommended. She had a right breast biopsy in 2014 for an abnormal MRI finding which was benign.  Since her last annual GYN exam dated 06/04/2019 , she has had no significant changes in her health.  She is sexually active. She is currently using birth control pills for contraception.  Her most recent pap smear was obtained 05/23/2017 and was with negative cells and negative HPV DNA.  Her most recent mammogram obtained on 07/04/2019 was benign. Marland Kitchen An MRI 12/01/2019 also was negative (BIRADS 1). Her next 3D mammogram is scheduled for next month.  There is a positive history of breast cancer in her mother. Genetic testing has been done. The patient tested negative for BRCA 1, negative for BRCA 2, and negative for BART( actually had a negative MYRISK).  There is no family history of ovarian cancer.  Colon cancer screening: Cologuard done last year was negative The patient does do monthly self breast exams.  The patient does not smoke.  The patient does drink occasionally.  The patient does not use illegal drugs.  The patient exercises regularly  (cardio 2x/wk and strength training 2x/wk)  The patient does get adequate calcium in her diet.  She had a recent cholesterol screen in 2019 that was normal.  The patient denies current symptoms of depression.    Review of Systems: Review of Systems  Constitutional: Negative for chills, fever, malaise/fatigue and weight loss.  HENT: Negative for congestion, sinus pain and sore throat.   Eyes: Negative for blurred vision and pain.  Respiratory: Negative for hemoptysis, shortness of breath and wheezing.   Cardiovascular: Negative for chest pain, palpitations and leg swelling.  Gastrointestinal: Negative for abdominal pain, blood in stool, diarrhea, heartburn, nausea and vomiting.  Genitourinary: Negative for dysuria, frequency, hematuria and urgency.  Musculoskeletal: Negative for back pain, joint pain and myalgias.  Skin: Negative for itching and rash.  Neurological: Negative for dizziness, tingling and headaches.  Endo/Heme/Allergies: Negative for environmental allergies and polydipsia. Does not bruise/bleed easily.       Negative for hirsutism   Psychiatric/Behavioral: Negative for depression. The patient is not nervous/anxious and does not have insomnia.     Past Medical History:  Past Medical History:  Diagnosis Date  . Acne   . Family history of breast cancer    mom was BRCA 1/2 neg. Patient is MYRISK negative. Lifetime risk 29.2%  . Genetic testing of female 06/2013   My Risk neg  . History of mammogram 05/01/2012   BIRADS1 @ [facility] 11/25/15; BIRAD2 cAT 2 BREAST mri 10/16;  BIRAD2 06/06/16 [facility] MRI 11/3 BENIGN  . History of Papanicolaou smear of cervix 03/22/2012   neg  . Increased risk of breast cancer 2014   last mammogram 06/22/17 neg; last breast MRI-11/16/2017  . Irritable bowel disease   . Seasonal allergies     Past Surgical History:  Past Surgical History:  Procedure Laterality Date  . BREAST BIOPSY  [id number]  . BREAST BIOPSY Right 06/19/2013   Procedure:  RIGHT BREAST  EXCISION WITH NEEDLE LOCALIZATION;  Surgeon: Rolm Bookbinder, MD;  Location: [place];  Service: General;  Laterality: Right;  . IR SCLEROTHERAPY OF A FLUID COLLECTION  2016  . MRI    . WISDOM TOOTH EXTRACTION      Family History:  Family History  Problem Relation Age of Onset  . Cancer Mother 30       breast, recurrence at age 70  . Hyperlipidemia Mother   . Thyroid disease Mother        hypothyroidism  . Hypertension Father   . Thyroid disease Maternal Grandmother   . Heart disease Maternal Grandfather   . Hypertension Maternal Grandfather   . Prostate cancer Maternal Grandfather 53  . Hypertension Paternal Grandmother   . Breast cancer Other 11    Social History:  Social History   Socioeconomic History  . Marital status: Married    Spouse name: Cynthia Haas  . Number of children: 1  . Years of education: 21  . Highest education level: Not on file  Occupational History  . Occupation: physician assistant  Tobacco Use  . Smoking status: Never Smoker  . Smokeless tobacco: Never Used  Vaping Use  . Vaping Use: Never used  Substance and Sexual Activity  . Alcohol use: Yes    Comment: occasionally  . Drug use: No  . Sexual activity: Yes    Partners: Female    Birth control/protection: Pill  Other Topics Concern  . Not on file  Social History Narrative  . Not on file   Social Determinants of Health   Financial Resource Strain:   . Difficulty of Paying Living Expenses:   Food Insecurity:   . Worried About Charity fundraiser in the Last Year:   . Arboriculturist in the Last Year:   Transportation Needs:   . Film/video editor (Medical):   Marland Kitchen Lack of Transportation (Non-Medical):   Physical Activity:   . Days of Exercise per Week:   . Minutes of Exercise per Session:   Stress:   . Feeling of Stress :   Social Connections:   . Frequency of Communication with Friends and Family:   . Frequency of Social Gatherings with Friends and Family:   .  Attends Religious Services:   . Active Member of Clubs or Organizations:   . Attends Archivist Meetings:   Marland Kitchen Marital Status:   Intimate Partner Violence:   . Fear of Current or Ex-Partner:   . Emotionally Abused:   Marland Kitchen Physically Abused:   . Sexually Abused:     Allergies:  No Known Allergies  Medications:  Current Outpatient Medications on File Prior to Visit  Medication Sig Dispense Refill  . Cholecalciferol (VITAMIN D3) 3000 UNITS TABS Take by mouth.    . Drospirenone (SLYND) 4 MG TABS Take 1 tablet by mouth daily.     No current facility-administered medications on file prior to visit.     Physical Exam Vitals:BP 115/89   Pulse 79   Ht 5'  7" (1.702 m)   Wt 152 lb (68.9 kg)   LMP 06/13/2020   BMI 23.81 kg/m  General: WF in NAD HEENT: normocephalic, anicteric Neck: no thyroid enlargement, no palpable nodules Pulmonary: No increased work of breathing, CTAB Cardiovascular: RRR, without murmur  Breast: Breast symmetrical, no tenderness, no palpable nodules or masses, no skin or nipple retraction present, no nipple discharge.  No axillary, infraclavicular or supraclavicular lymphadenopathy. Abdomen: Soft, non-tender, non-distended.  Umbilicus without lesions.  No hepatomegaly or masses palpable. No evidence of hernia. Genitourinary:  External: Normal external female genitalia.  Normal urethral meatus, normal Bartholin's and Skene's glands.    Vagina: Normal vaginal mucosa, no evidence of prolapse, blood present  Cervix: anterior, non-tender  Uterus: Retroflexed, normal size, shape, and consistency, mobile, and non-tender  Adnexa: No adnexal masses, non-tender  Rectal: deferred  Lymphatic: no evidence of inguinal lymphadenopathy Extremities: no edema, erythema, or tenderness Neurologic: Grossly intact Psychiatric: mood appropriate, affect full     Assessment: 49 y.o. G1P1001 well woman exam Family history of breast cancer with increased lifetime risk of  29.2%.  Plan:   1) Breast cancer screening - recommend monthly self breast exam, annual screening mammograms and MRIs of the breast. Her next mammogram is already scheduled in [place] 07/03/20.  MRI due in Jan 2022  2) Cervical cancer screening - Pap was done. ASCCP guidelines and rational discussed.  Patient opts for every 3 years screening interval.  3) Contraception - continue  Slynd  4) Routine healthcare maintenance labs drawn today:  TSH, CMP and vitamin D level, hemoglobin A1C  5) Colon cancer screening: Cologuard was negative last year.  5) RTO 1 year and prn.  Cynthia Haas, CNM

## 2020-06-21 LAB — CYTOLOGY - PAP
Comment: NEGATIVE
Diagnosis: NEGATIVE
High risk HPV: NEGATIVE

## 2020-06-24 ENCOUNTER — Other Ambulatory Visit: Payer: Self-pay

## 2020-06-24 ENCOUNTER — Other Ambulatory Visit: Payer: 59

## 2020-06-24 DIAGNOSIS — Z0000 Encounter for general adult medical examination without abnormal findings: Secondary | ICD-10-CM

## 2020-06-24 DIAGNOSIS — Z131 Encounter for screening for diabetes mellitus: Secondary | ICD-10-CM

## 2020-06-24 DIAGNOSIS — Z13220 Encounter for screening for lipoid disorders: Secondary | ICD-10-CM | POA: Diagnosis not present

## 2020-06-25 LAB — VITAMIN D 25 HYDROXY (VIT D DEFICIENCY, FRACTURES): Vit D, 25-Hydroxy: 84.1 ng/mL (ref 30.0–100.0)

## 2020-06-25 LAB — CMP14+EGFR
ALT: 14 IU/L (ref 0–32)
AST: 20 IU/L (ref 0–40)
Albumin/Globulin Ratio: 2.2 (ref 1.2–2.2)
Albumin: 4.9 g/dL — ABNORMAL HIGH (ref 3.8–4.8)
Alkaline Phosphatase: 66 IU/L (ref 48–121)
BUN/Creatinine Ratio: 13 (ref 9–23)
BUN: 11 mg/dL (ref 6–24)
Bilirubin Total: 0.5 mg/dL (ref 0.0–1.2)
CO2: 23 mmol/L (ref 20–29)
Calcium: 9.7 mg/dL (ref 8.7–10.2)
Chloride: 101 mmol/L (ref 96–106)
Creatinine, Ser: 0.86 mg/dL (ref 0.57–1.00)
GFR calc Af Amer: 94 mL/min/{1.73_m2} (ref >59)
GFR calc non Af Amer: 81 mL/min/{1.73_m2} (ref >59)
Globulin, Total: 2.2 g/dL (ref 1.5–4.5)
Glucose: 88 mg/dL (ref 65–99)
Potassium: 4.3 mmol/L (ref 3.5–5.2)
Sodium: 137 mmol/L (ref 134–144)
Total Protein: 7.1 g/dL (ref 6.0–8.5)

## 2020-06-25 LAB — LIPID PANEL WITH LDL/HDL RATIO
Cholesterol, Total: 202 mg/dL — ABNORMAL HIGH (ref 100–199)
HDL: 73 mg/dL (ref >39)
LDL Chol Calc (NIH): 119 mg/dL — ABNORMAL HIGH (ref 0–99)
LDL/HDL Ratio: 1.6 ratio (ref 0.0–3.2)
Triglycerides: 57 mg/dL (ref 0–149)
VLDL Cholesterol Cal: 10 mg/dL (ref 5–40)

## 2020-06-25 LAB — TSH: TSH: 2.09 u[IU]/mL (ref 0.450–4.500)

## 2020-06-30 NOTE — Progress Notes (Signed)
This encounter was created in error - please disregard.

## 2020-06-30 NOTE — Addendum Note (Signed)
Addended by: Cleophas Dunker D on: 06/30/2020 11:56 AM   Modules accepted: Level of Service, SmartSet

## 2020-07-09 ENCOUNTER — Encounter: Payer: Self-pay | Admitting: Certified Nurse Midwife

## 2020-07-09 DIAGNOSIS — Z1231 Encounter for screening mammogram for malignant neoplasm of breast: Secondary | ICD-10-CM | POA: Diagnosis not present

## 2020-12-01 ENCOUNTER — Other Ambulatory Visit: Payer: Self-pay | Admitting: Obstetrics and Gynecology

## 2020-12-01 DIAGNOSIS — Z1239 Encounter for other screening for malignant neoplasm of breast: Secondary | ICD-10-CM

## 2020-12-01 DIAGNOSIS — Z803 Family history of malignant neoplasm of breast: Secondary | ICD-10-CM

## 2020-12-01 DIAGNOSIS — Z9189 Other specified personal risk factors, not elsewhere classified: Secondary | ICD-10-CM

## 2020-12-10 DIAGNOSIS — Z135 Encounter for screening for eye and ear disorders: Secondary | ICD-10-CM | POA: Diagnosis not present

## 2020-12-10 DIAGNOSIS — H5203 Hypermetropia, bilateral: Secondary | ICD-10-CM | POA: Diagnosis not present

## 2020-12-10 DIAGNOSIS — H524 Presbyopia: Secondary | ICD-10-CM | POA: Diagnosis not present

## 2020-12-10 DIAGNOSIS — H52223 Regular astigmatism, bilateral: Secondary | ICD-10-CM | POA: Diagnosis not present

## 2020-12-24 ENCOUNTER — Ambulatory Visit
Admission: RE | Admit: 2020-12-24 | Discharge: 2020-12-24 | Disposition: A | Discharge status: Home / Self Care | Payer: 59 | Source: Ambulatory Visit | Attending: Obstetrics and Gynecology | Admitting: Obstetrics and Gynecology

## 2020-12-24 ENCOUNTER — Other Ambulatory Visit: Payer: Self-pay

## 2020-12-24 DIAGNOSIS — Z9189 Other specified personal risk factors, not elsewhere classified: Secondary | ICD-10-CM

## 2020-12-24 DIAGNOSIS — Z803 Family history of malignant neoplasm of breast: Secondary | ICD-10-CM

## 2020-12-24 DIAGNOSIS — Z1501 Genetic susceptibility to malignant neoplasm of breast: Secondary | ICD-10-CM | POA: Diagnosis not present

## 2020-12-24 DIAGNOSIS — Z1239 Encounter for other screening for malignant neoplasm of breast: Secondary | ICD-10-CM | POA: Diagnosis not present

## 2020-12-24 DIAGNOSIS — N6489 Other specified disorders of breast: Secondary | ICD-10-CM | POA: Diagnosis not present

## 2020-12-24 MED ORDER — GADOBUTROL 1 MMOL/ML IV SOLN
7.0000 mL | Freq: Once | INTRAVENOUS | Status: AC | PRN
  Administered 2020-12-24: 7 mL via INTRAVENOUS

## 2020-12-31 DIAGNOSIS — M65352 Trigger finger, left little finger: Secondary | ICD-10-CM | POA: Diagnosis not present

## 2021-02-08 ENCOUNTER — Other Ambulatory Visit: Payer: Self-pay

## 2021-02-08 ENCOUNTER — Other Ambulatory Visit: Payer: Self-pay | Admitting: Obstetrics & Gynecology

## 2021-02-08 MED ORDER — CIPROFLOXACIN HCL 500 MG PO TABS
500.0000 mg | ORAL_TABLET | Freq: Two times a day (BID) | ORAL | 1 refills | Status: DC
  Filled 2021-02-08: qty 12, 6d supply, fill #0

## 2021-04-14 ENCOUNTER — Other Ambulatory Visit: Payer: Self-pay

## 2021-04-14 MED ORDER — CARESTART COVID-19 HOME TEST VI KIT
PACK | 0 refills | Status: DC
  Filled 2021-04-14: qty 2, 4d supply, fill #0

## 2021-04-19 ENCOUNTER — Other Ambulatory Visit: Payer: Self-pay

## 2021-06-01 ENCOUNTER — Telehealth: Payer: Self-pay

## 2021-06-01 ENCOUNTER — Other Ambulatory Visit: Payer: Self-pay | Admitting: Obstetrics and Gynecology

## 2021-06-01 ENCOUNTER — Other Ambulatory Visit: Payer: Self-pay

## 2021-06-01 DIAGNOSIS — Z3009 Encounter for other general counseling and advice on contraception: Secondary | ICD-10-CM

## 2021-06-01 MED ORDER — ETONOGESTREL-ETHINYL ESTRADIOL 0.12-0.015 MG/24HR VA RING
VAGINAL_RING | VAGINAL | 12 refills | Status: DC
  Filled 2021-06-01: qty 3, 84d supply, fill #0
  Filled 2021-08-12: qty 3, 84d supply, fill #1

## 2021-06-01 NOTE — Telephone Encounter (Signed)
Pt would like Rx for nuvaring sent to [facility]. Has done well in the past with it. Annual scheduled with you in August. Was seeing CLG.

## 2021-06-01 NOTE — Progress Notes (Signed)
Nuvaring order

## 2021-06-22 ENCOUNTER — Other Ambulatory Visit: Payer: Self-pay

## 2021-06-22 ENCOUNTER — Encounter: Payer: Self-pay | Admitting: Obstetrics and Gynecology

## 2021-06-22 ENCOUNTER — Ambulatory Visit (INDEPENDENT_AMBULATORY_CARE_PROVIDER_SITE_OTHER): Payer: 59 | Admitting: Obstetrics and Gynecology

## 2021-06-22 VITALS — BP 110/72 | Ht 67.0 in | Wt 150.0 lb

## 2021-06-22 DIAGNOSIS — Z13220 Encounter for screening for lipoid disorders: Secondary | ICD-10-CM

## 2021-06-22 DIAGNOSIS — R5382 Chronic fatigue, unspecified: Secondary | ICD-10-CM

## 2021-06-22 DIAGNOSIS — Z1239 Encounter for other screening for malignant neoplasm of breast: Secondary | ICD-10-CM

## 2021-06-22 DIAGNOSIS — Z131 Encounter for screening for diabetes mellitus: Secondary | ICD-10-CM

## 2021-06-22 DIAGNOSIS — Z3044 Encounter for surveillance of vaginal ring hormonal contraceptive device: Secondary | ICD-10-CM

## 2021-06-22 DIAGNOSIS — Z1329 Encounter for screening for other suspected endocrine disorder: Secondary | ICD-10-CM

## 2021-06-22 DIAGNOSIS — Z0000 Encounter for general adult medical examination without abnormal findings: Secondary | ICD-10-CM

## 2021-06-22 DIAGNOSIS — Z01419 Encounter for gynecological examination (general) (routine) without abnormal findings: Secondary | ICD-10-CM

## 2021-06-22 MED ORDER — ETONOGESTREL-ETHINYL ESTRADIOL 0.12-0.015 MG/24HR VA RING
VAGINAL_RING | VAGINAL | 4 refills | Status: DC
  Filled 2021-06-22 – 2021-11-14 (×2): qty 3, 84d supply, fill #0
  Filled 2022-01-30: qty 3, 84d supply, fill #1
  Filled 2022-04-27: qty 3, 84d supply, fill #2

## 2021-06-22 NOTE — Progress Notes (Signed)
Gynecology Annual Exam  PCP: Dalia Heading, CNM (Inactive)  Chief Complaint:  Chief Complaint  Patient presents with   Gynecologic Exam    History of Present Illness: Patient is a 50 y.o. G1P1001 presents for annual exam. The patient has no complaints today.   LMP: Patient's last menstrual period was 06/04/2021. Average Interval: regular, monthly but generally uses continuousbirth control Duration of flow: 7 days  Heavy Menses: yes Dysmenorrhea: no  She denies passage of large clots She reports sensations of gushing or flooding of blood. She denies accidents where she bleeds through her clothing. She denies that she changes a saturated pad or tampon more frequently than every hour.  She denies that pain from her periods limits her activities.  The patient does perform self breast exams.  There is notable family history of breast or ovarian cancer in her family.  The patient has regular exercise: yes wts and cardio 4 times a week  The patient denies current symptoms of depression.   PHQ-9: 0 GAD-7: 0   Review of Systems: ROS  Past Medical History:  Past Medical History:  Diagnosis Date   Acne    Family history of breast cancer    mom was BRCA 1/2 neg. Patient is MYRISK negative. Lifetime risk 29.2%   Genetic testing of female 06/2013   My Risk neg   History of mammogram 05/01/2012   BIRADS1 @ [facility] 11/25/15; BIRAD2 cAT 2 BREAST mri 10/16; BIRAD2 06/06/16 [facility] MRI 11/3 BENIGN   History of Papanicolaou smear of cervix 03/22/2012   neg   Increased risk of breast cancer 2014   last mammogram 06/22/17 neg; last breast MRI-11/16/2017   Irritable bowel disease    Seasonal allergies     Past Surgical History:  Past Surgical History:  Procedure Laterality Date   BREAST BIOPSY  [id number]   BREAST BIOPSY Right 06/19/2013   Procedure:  RIGHT BREAST EXCISION WITH NEEDLE LOCALIZATION;  Surgeon: Rolm Bookbinder, MD;  Location: [facility];  Service:  General;  Laterality: Right;   IR SCLEROTHERAPY OF A FLUID COLLECTION  2016   MRI     WISDOM TOOTH EXTRACTION      Gynecologic History:  Patient's last menstrual period was 06/04/2021. Menarche: 13  History of fibroids, polyps, or ovarian cysts? : no  History of PCOS? no Hstory of Endometriosis? no History of abnormal pap smears? no Have you had any sexually transmitted infections in the past?  no  Last Pap: Results were: 2020 NIL and HR HPV negative   She identifies as a female. She is sexually active with men.   She denies dyspareunia. She denies postcoital bleeding.    Obstetric History: G1P1001  Family History:  Family History  Problem Relation Age of Onset   Cancer Mother 69       breast, recurrence at age 67   Hyperlipidemia Mother    Thyroid disease Mother        hypothyroidism   Hypertension Father    Thyroid disease Maternal Grandmother    Heart disease Maternal Grandfather    Hypertension Maternal Grandfather    Prostate cancer Maternal Grandfather 26   Hypertension Paternal Grandmother    Breast cancer Other 42    Social History:  Social History   Socioeconomic History   Marital status: Married    Spouse name: Corene Cornea   Number of children: 1   Years of education: 32   Highest education level: Not on file  Occupational History  Occupation: physician assistant  Tobacco Use   Smoking status: Never   Smokeless tobacco: Never  Vaping Use   Vaping Use: Never used  Substance and Sexual Activity   Alcohol use: Yes    Comment: occasionally   Drug use: No   Sexual activity: Yes    Partners: Female    Birth control/protection: Pill  Other Topics Concern   Not on file  Social History Narrative   Not on file   Social Determinants of Health   Financial Resource Strain: Not on file  Food Insecurity: Not on file  Transportation Needs: Not on file  Physical Activity: Not on file  Stress: Not on file  Social Connections: Not on file  Intimate  Partner Violence: Not on file    Allergies:  No Known Allergies  Medications: Prior to Admission medications   Medication Sig Start Date End Date Taking? Authorizing Provider  Cholecalciferol (VITAMIN D3) 3000 UNITS TABS Take by mouth.   Yes [provider]  COVID-19 At Home Antigen Test [facility] COVID-19 HOME TEST) KIT test as directed 04/13/21  Yes Nicks, Ewing Schlein, RPH  etonogestrel-ethinyl estradiol (NUVARING) 0.12-0.015 MG/24HR vaginal ring Insert vaginally and leave in place for 3 consecutive weeks, then remove for 1 week. 06/01/21  Yes Hazaiah Edgecombe, Stefanie Libel, MD  etonogestrel-ethinyl estradiol (NUVARING) 0.12-0.015 MG/24HR vaginal ring Insert vaginally and leave in place for 3 consecutive weeks, then remove for 1 week. 06/22/21  Yes Trease Bremner R, MD  ciprofloxacin (CIPRO) 500 MG tablet Take 1 tablet (500 mg total) by mouth 2 (two) times daily. 02/08/21   Gae Dry, MD  Drospirenone (SLYND) 4 MG TABS Take 1 tablet by mouth daily.    [provider]    Physical Exam Vitals: Blood pressure 110/72, height 5' 7" (1.702 m), weight 150 lb (68 kg), last menstrual period 06/04/2021.  Physical Exam Constitutional:      Appearance: She is well-developed.  Genitourinary:     Genitourinary Comments: External: Normal appearing vulva. No lesions noted.   Bimanual examination: Uterus midline, non-tender, normal in size, shape and contour.  No CMT. No adnexal masses. No adnexal tenderness. Pelvis not fixed.  Breast Exam: breast equal without skin changes, nipple discharge, breast lump or enlarged lymph nodes   HENT:     Head: Normocephalic and atraumatic.  Neck:     Thyroid: No thyromegaly.  Cardiovascular:     Rate and Rhythm: Normal rate and regular rhythm.     Heart sounds: Normal heart sounds.  Pulmonary:     Effort: Pulmonary effort is normal.     Breath sounds: Normal breath sounds.  Abdominal:     General: Bowel sounds are normal. There is no  distension.     Palpations: Abdomen is soft. There is no mass.  Musculoskeletal:     Cervical back: Neck supple.  Neurological:     Mental Status: She is alert and oriented to person, place, and time.  Skin:    General: Skin is warm and dry.  Psychiatric:        Behavior: Behavior normal.        Thought Content: Thought content normal.        Judgment: Judgment normal.  Vitals reviewed.     Female chaperone present for pelvic and breast  portions of the physical exam  Assessment: 50 y.o. G1P1001 routine annual exam  Plan: Problem List Items Addressed This Visit   None Visit Diagnoses     Encounter for gynecological examination  without abnormal finding    -  Primary   Encounter for surveillance of vaginal ring hormonal contraceptive device       Relevant Medications   etonogestrel-ethinyl estradiol (NUVARING) 0.12-0.015 MG/24HR vaginal ring   Chronic fatigue       Relevant Orders   Comprehensive metabolic panel   TSH   Vitamin D (25 hydroxy)   Screening for diabetes mellitus       Relevant Orders   Comprehensive metabolic panel   Screening for thyroid disorder       Relevant Orders   TSH   Screening cholesterol level       Relevant Orders   Lipid panel   Encounter for annual routine gynecological examination       Health maintenance examination       Encounter for screening breast examination           1) Mammogram - recommend yearly screening mammogram.  Mammogram Is up to date. Breast MRI planned for February 2023.  2) STI screening was offered and declined  3) ASCCP guidelines and rational discussed. Pap smear is up to date  4) Contraception - Nuvaring, rx sent.   5) Cologuard in 2020, next due 2023  6) Routine healthcare maintenance including cholesterol, diabetes screening discussed Ordered today,  Adrian Prows MD, [place], [facility] 06/22/2021 12:54 PM

## 2021-06-22 NOTE — Patient Instructions (Signed)
Institute of [place] for Calcium and Vitamin D  Age (yr) Calcium Recommended Dietary Allowance (mg/day) Vitamin D Recommended Dietary Allowance (international units/day)  9-18 1,300 600  19-50 1,000 600  51-70 1,200 600  71 and older 1,200 800  Data from Institute of Medicine. Dietary reference intakes: calcium, vitamin D. [place], [place]: [employer]; 2011.   Exercising to Stay Healthy To become healthy and stay healthy, it is recommended that you do moderate-intensity and vigorous-intensity exercise. You can tell that you are exercising at a moderate intensity if your heart starts beating faster and you start breathing faster but can still hold a conversation. You can tell that you are exercising at a vigorous intensity if you are breathing much harder andfaster and cannot hold a conversation while exercising. Exercising regularly is important. It has many health benefits, such as: Improving overall fitness, flexibility, and endurance. Increasing bone density. Helping with weight control. Decreasing body fat. Increasing muscle strength. Reducing stress and tension. Improving overall health. How often should I exercise? Choose an activity that you enjoy, and set realistic goals. Your health careprovider can help you make an activity plan that works for you. Exercise regularly as told by your health care provider. This may include: Doing strength training two times a week, such as: Lifting weights. Using resistance bands. Push-ups. Sit-ups. Yoga. Doing a certain intensity of exercise for a given amount of time. Choose from these options: A total of 150 minutes of moderate-intensity exercise every week. A total of 75 minutes of vigorous-intensity exercise every week. A mix of moderate-intensity and vigorous-intensity exercise every week. Children, pregnant women, people who have not exercised regularly, people who are overweight, and  older adults may need to talk with a health care provider about what activities are safe to do. If you have a medical condition, be sureto talk with your health care provider before you start a new exercise program. What are some exercise ideas? Moderate-intensity exercise ideas include: Walking 1 mile (1.6 km) in about 15 minutes. Biking. Hiking. Golfing. Dancing. Water aerobics. Vigorous-intensity exercise ideas include: Walking 4.5 miles (7.2 km) or more in about 1 hour. Jogging or running 5 miles (8 km) in about 1 hour. Biking 10 miles (16.1 km) or more in about 1 hour. Lap swimming. Roller-skating or in-line skating. Cross-country skiing. Vigorous competitive sports, such as football, basketball, and soccer. Jumping rope. Aerobic dancing. What are some everyday activities that can help me to get exercise? Yard work, such as: Psychologist, educational. Raking and bagging leaves. Washing your car. Pushing a stroller. Shoveling snow. Gardening. Washing windows or floors. How can I be more active in my day-to-day activities? Use stairs instead of an elevator. Take a walk during your lunch break. If you drive, park your car farther away from your work or school. If you take public transportation, get off one stop early and walk the rest of the way. Stand up or walk around during all of your indoor phone calls. Get up, stretch, and walk around every 30 minutes throughout the day. Enjoy exercise with a friend. Support to continue exercising will help you keep a regular routine of activity. What guidelines can I follow while exercising? Before you start a new exercise program, talk with your health care provider. Do not exercise so much that you hurt yourself, feel dizzy, or get very short of breath. Wear comfortable clothes and wear shoes with good support. Drink plenty of water while you exercise to prevent  dehydration or heat stroke. Work out until your breathing and your  heartbeat get faster. Where to find more information U.S. Department of Health and Human Services: [employer].at Centers for Disease Control and Prevention (CDC): http://www.wolf.info/ Summary Exercising regularly is important. It will improve your overall fitness, flexibility, and endurance. Regular exercise also will improve your overall health. It can help you control your weight, reduce stress, and improve your bone density. Do not exercise so much that you hurt yourself, feel dizzy, or get very short of breath. Before you start a new exercise program, talk with your health care provider. This information is not intended to replace advice given to you by your health care provider. Make sure you discuss any questions you have with your healthcare provider. Document Revised: 10/08/2020 Document Reviewed: 10/08/2020 Elsevier Patient Education  2022 [place]. Budget-Friendly Healthy Eating There are many ways to save money at the grocery store and continue to eat healthy. You can be successful if you: Plan meals according to your budget. Make a grocery list and only purchase food according to your grocery list. Prepare food yourself at home. What are tips for following this plan? Reading food labels Compare food labels between brand name foods and the store brand. Often the nutritional value is the same, but the store brand is lower cost. Look for products that do not have added sugar, fat, or salt (sodium). These often cost the same but are healthier for you. Products may be labeled as: Sugar-free. Nonfat. Low-fat. Sodium-free. Low-sodium. Look for lean ground beef labeled as at least 92% lean and 8% fat. Shopping  Buy only the items on your grocery list and go only to the areas of the store that have the items on your list. Use coupons only for foods and brands you normally buy. Avoid buying items you wouldn't normally buy simply because they are on sale. Check online and in newspapers for  weekly deals. Buy healthy items from the bulk bins when available, such as herbs, spices, flour, pasta, nuts, and dried fruit. Buy fruits and vegetables that are in season. Prices are usually lower on in-season produce. Look at the unit price on the price tag. Use it to compare different brands and sizes to find out which item is the best deal. Choose healthy items that are often low-cost, such as carrots, potatoes, apples, bananas, and oranges. Dried or canned beans are a low-cost protein source. Buy in bulk and freeze extra food. Items you can buy in bulk include meats, fish, poultry, frozen fruits, and frozen vegetables. Avoid buying "ready-to-eat" foods, such as pre-cut fruits and vegetables and pre-made salads. If possible, shop around to discover where you can find the best prices. Consider other retailers such as dollar stores, larger [employer], local fruit and vegetable stands, and farmers markets. Do not shop when you are hungry. If you shop while hungry, it may be hard to stick to your list and budget. Resist impulse buying. Use your grocery list as your official plan for the week. Buy a variety of vegetables and fruits by purchasing fresh, frozen, and canned items. Look at the top and bottom shelves for deals. Foods at eye level (eye level of an adult or child) are usually more expensive. Be efficient with your time when shopping. The more time you spend at the store, the more money you are likely to spend. To save money when choosing more expensive foods like meats and dairy: Choose cheaper cuts of meat, such as bone-in  chicken thighs and drumsticks instead of skinless and boneless chicken. When you are ready to prepare the chicken, you can remove the skin yourself to make it healthier. Choose lean meats like chicken or [place] instead of beef. Choose canned seafood, such as tuna, salmon, or sardines. Buy eggs as a low-cost source of protein. Buy dried beans and peas, such as  lentils, split peas, or kidney beans instead of meats. Dried beans and peas are a good alternative source of protein. Buy the larger tubs of yogurt instead of individual-sized containers. Choose water instead of sodas and other sweetened beverages. Avoid buying chips, cookies, and other "junk food." These items are usually expensive and not healthy.  Cooking Make extra food and freeze the extras in meal-sized containers or in individual portions for fast meals and snacks. Pre-cook on days when you have extra time to prepare meals in advance. You can keep these meals in the fridge or freezer and reheat for a quick meal. When you come home from the grocery store, wash, peel, and cut fruits and vegetables so they are ready to use and eat. This will help reduce food waste. Meal planning Do not eat out or get fast food. Prepare food at home. Make a grocery list and make sure to bring it with you to the store. If you have a smart phone, you could use your phone to create your shopping list. Plan meals and snacks according to a grocery list and budget you create. Use leftovers in your meal plan for the week. Look for recipes where you can cook once and make enough food for two meals. Prepare budget-friendly types of meals like stews, casseroles, and stir-fry dishes. Try some meatless meals or try "no cook" meals like salads. Make sure that half your plate is filled with fruits or vegetables. Choose from fresh, frozen, or canned fruits and vegetables. If eating canned, remember to rinse them before eating. This will remove any excess salt added for packaging. Summary Eating healthy on a budget is possible if you plan your meals according to your budget, purchase according to your budget and grocery list, and prepare food yourself. Tips for buying more food on a limited budget include buying generic brands, using coupons only for foods you normally buy, and buying healthy items from the bulk bins when  available. Tips for buying cheaper food to replace expensive food include choosing cheaper, lean cuts of meat, and buying dried beans and peas. This information is not intended to replace advice given to you by your health care provider. Make sure you discuss any questions you have with your healthcare provider. Document Revised: 08/05/2020 Document Reviewed: 08/05/2020 Elsevier Patient Education  [place] protect organs, store calcium, anchor muscles, and support the whole body. Keeping your bones strong is important, especially as you get older. Youcan take actions to help keep your bones strong and healthy. Why is keeping my bones healthy important?  Keeping your bones healthy is important because your body constantly replaces bone cells. Cells get old, and new cells take their place. As we age, we lose bone cells because the body may not be able to make enough new cells to replace the old cells. The amount of bone cells and bone tissue you have is referred toas bone mass. The higher your bone mass, the stronger your bones. The aging process leads to an overall loss of bone mass in the body, which can increase the likelihood of: Joint pain  and stiffness. Broken bones. A condition in which the bones become weak and brittle (osteoporosis). A large decline in bone mass occurs in older adults. In women, it occurs aboutthe time of menopause. What actions can I take to keep my bones healthy? Good health habits are important for maintaining healthy bones. This includes eating nutritious foods and exercising regularly. To have healthy bones, you need to get enough of the right minerals and vitamins. Most nutrition experts recommend getting these nutrients from the foods that you eat. In some cases, taking supplements may also be recommended. Doing certain types of exercise isalso important for bone health. What are the nutritional recommendations for healthy bones?  Eating  a well-balanced diet with plenty of calcium and vitamin D will help to protect your bones. Nutritional recommendations vary from person to person. Ask your health care provider what is healthy for you. Here are some generalguidelines. Get enough calcium Calcium is the most important (essential) mineral for bone health. Most people can get enough calcium from their diet, but supplements may be recommended for people who are at risk for osteoporosis. Good sources of calcium include: Dairy products, such as low-fat or nonfat milk, cheese, and yogurt. Dark green leafy vegetables, such as bok choy and broccoli. Calcium-fortified foods, such as orange juice, cereal, bread, soy beverages, and tofu products. Nuts, such as almonds. Follow these recommended amounts for daily calcium intake: Children, age 30-3: 700 mg. Children, age 61-8: 1,000 mg. Children, age 83-13: 1,300 mg. Teens, age 69-18: 1,300 mg. Adults, age 613-50: 1,000 mg. Adults, age 611-70: Men: 1,000 mg. Women: 1,200 mg. Adults, age 61 or older: 1,200 mg. Pregnant and breastfeeding females: Teens: 1,300 mg. Adults: 1,000 mg. Get enough vitamin D Vitamin D is the most essential vitamin for bone health. It helps the body absorb calcium. Sunlight stimulates the skin to make vitamin D, so be sure to get enough sunlight. If you live in a cold climate or you do not get outside often, your health care provider may recommend that you take vitamin D supplements. Good sources of vitamin D in your diet include: Egg yolks. Saltwater fish. Milk and cereal fortified with vitamin D. Follow these recommended amounts for daily vitamin D intake: Children and teens, age 30-18: 600 international units. Adults, age 61 or younger: 400-800 international units. Adults, age 51 or older: 800-1,000 international units. Get other important nutrients Other nutrients that are important for bone health include: Phosphorus. This mineral is found in meat, poultry,  dairy foods, nuts, and legumes. The recommended daily intake for adult men and adult women is 700 mg. Magnesium. This mineral is found in seeds, nuts, dark green vegetables, and legumes. The recommended daily intake for adult men is 400-420 mg. For adult women, it is 310-320 mg. Vitamin K. This vitamin is found in green leafy vegetables. The recommended daily intake is 120 mg for adult men and 90 mg for adult women. What type of physical activity is best for building and maintaining healthybones? Weight-bearing and strength-building activities are important for building and maintaining healthy bones. Weight-bearing activities cause muscles and bones to work against gravity. Strength-building activities increase the strength of the muscles that support bones. Weight-bearing and muscle-building activities include: Walking and hiking. Jogging and running. Dancing. Gym exercises. Lifting weights. Tennis and racquetball. Climbing stairs. Aerobics. Adults should get at least 30 minutes of moderate physical activity on most days. Children should get at least 60 minutes of moderate physical activity onmost days. Ask your health care  provider what type of exercise is best for you. How can I find out if my bone mass is low? Bone mass can be measured with an X-ray test called a bone mineral density (BMD) test. This test is recommended for all women who are age 61 or older. It may also be recommended for: Men who are age 66 or older. People who are at risk for osteoporosis because of: Having bones that break easily. Having a long-term disease that weakens bones, such as kidney disease or rheumatoid arthritis. Having menopause earlier than normal. Taking medicine that weakens bones, such as steroids, thyroid hormones, or hormone treatment for breast cancer or prostate cancer. Smoking. Drinking three or more alcoholic drinks a day. If you find that you have a low bone mass, you may be able to  preventosteoporosis or further bone loss by changing your diet and lifestyle. Where can I find more information? For more information, check out the following websites: [place]: [employer].fr [employer] of Health: www.bones.SouthExposed.es International Osteoporosis Foundation: Administrator.iofbonehealth.org Summary The aging process leads to an overall loss of bone mass in the body, which can increase the likelihood of broken bones and osteoporosis. Eating a well-balanced diet with plenty of calcium and vitamin D will help to protect your bones. Weight-bearing and strength-building activities are also important for building and maintaining strong bones. Bone mass can be measured with an X-ray test called a bone mineral density (BMD) test. This information is not intended to replace advice given to you by your health care provider. Make sure you discuss any questions you have with your healthcare provider. Document Revised: 11/19/2017 Document Reviewed: 11/19/2017 Elsevier Patient Education  2022 [employer].

## 2021-06-23 LAB — COMPREHENSIVE METABOLIC PANEL
ALT: 10 IU/L (ref 0–32)
AST: 17 IU/L (ref 0–40)
Albumin/Globulin Ratio: 2.1 (ref 1.2–2.2)
Albumin: 4.7 g/dL (ref 3.8–4.8)
Alkaline Phosphatase: 62 IU/L (ref 44–121)
BUN/Creatinine Ratio: 16 (ref 9–23)
BUN: 13 mg/dL (ref 6–24)
Bilirubin Total: 0.4 mg/dL (ref 0.0–1.2)
CO2: 19 mmol/L — ABNORMAL LOW (ref 20–29)
Calcium: 9.4 mg/dL (ref 8.7–10.2)
Chloride: 102 mmol/L (ref 96–106)
Creatinine, Ser: 0.82 mg/dL (ref 0.57–1.00)
Globulin, Total: 2.2 g/dL (ref 1.5–4.5)
Glucose: 85 mg/dL (ref 65–99)
Potassium: 4.4 mmol/L (ref 3.5–5.2)
Sodium: 137 mmol/L (ref 134–144)
Total Protein: 6.9 g/dL (ref 6.0–8.5)
eGFR: 89 mL/min/{1.73_m2} (ref >59)

## 2021-06-23 LAB — TSH: TSH: 1.93 u[IU]/mL (ref 0.450–4.500)

## 2021-06-23 LAB — LIPID PANEL
Chol/HDL Ratio: 3 ratio (ref 0.0–4.4)
Cholesterol, Total: 204 mg/dL — ABNORMAL HIGH (ref 100–199)
HDL: 69 mg/dL (ref >39)
LDL Chol Calc (NIH): 116 mg/dL — ABNORMAL HIGH (ref 0–99)
Triglycerides: 108 mg/dL (ref 0–149)
VLDL Cholesterol Cal: 19 mg/dL (ref 5–40)

## 2021-06-23 LAB — VITAMIN D 25 HYDROXY (VIT D DEFICIENCY, FRACTURES): Vit D, 25-Hydroxy: 108 ng/mL — ABNORMAL HIGH (ref 30.0–100.0)

## 2021-07-21 ENCOUNTER — Encounter: Payer: Self-pay | Admitting: Obstetrics and Gynecology

## 2021-07-21 DIAGNOSIS — Z1231 Encounter for screening mammogram for malignant neoplasm of breast: Secondary | ICD-10-CM | POA: Diagnosis not present

## 2021-08-12 ENCOUNTER — Other Ambulatory Visit: Payer: Self-pay

## 2021-09-21 ENCOUNTER — Other Ambulatory Visit: Payer: Self-pay

## 2021-09-21 MED ORDER — CARESTART COVID-19 HOME TEST VI KIT
PACK | 0 refills | Status: DC
  Filled 2021-09-21: qty 2, 4d supply, fill #0

## 2021-10-28 ENCOUNTER — Telehealth: Payer: Self-pay | Admitting: Certified Nurse Midwife

## 2021-10-28 NOTE — Telephone Encounter (Signed)
Per Dr Derrel Nip on 10/28/2021, ok to set up New Patient appointment. Called and lm to give office a call to set up New Patient appointment in the New year.

## 2021-11-14 ENCOUNTER — Other Ambulatory Visit: Payer: Self-pay

## 2021-11-15 ENCOUNTER — Other Ambulatory Visit: Payer: Self-pay | Admitting: Obstetrics and Gynecology

## 2021-11-15 ENCOUNTER — Encounter: Payer: Self-pay | Admitting: Obstetrics and Gynecology

## 2021-11-15 DIAGNOSIS — Z9189 Other specified personal risk factors, not elsewhere classified: Secondary | ICD-10-CM

## 2021-11-15 DIAGNOSIS — Z803 Family history of malignant neoplasm of breast: Secondary | ICD-10-CM

## 2021-11-15 NOTE — Progress Notes (Signed)
Breast MRI ordered

## 2021-12-30 ENCOUNTER — Ambulatory Visit
Admission: RE | Admit: 2021-12-30 | Discharge: 2021-12-30 | Disposition: A | Discharge status: Home / Self Care | Payer: Self-pay | Source: Ambulatory Visit | Attending: Obstetrics and Gynecology | Admitting: Obstetrics and Gynecology

## 2021-12-30 ENCOUNTER — Other Ambulatory Visit: Payer: Self-pay

## 2021-12-30 DIAGNOSIS — Z1239 Encounter for other screening for malignant neoplasm of breast: Secondary | ICD-10-CM | POA: Diagnosis not present

## 2021-12-30 DIAGNOSIS — Z803 Family history of malignant neoplasm of breast: Secondary | ICD-10-CM

## 2021-12-30 DIAGNOSIS — Z9189 Other specified personal risk factors, not elsewhere classified: Secondary | ICD-10-CM

## 2021-12-30 MED ORDER — GADOBUTROL 1 MMOL/ML IV SOLN
7.0000 mL | Freq: Once | INTRAVENOUS | Status: AC | PRN
  Administered 2021-12-30: 7 mL via INTRAVENOUS

## 2022-01-05 ENCOUNTER — Other Ambulatory Visit: Payer: Self-pay

## 2022-01-05 ENCOUNTER — Other Ambulatory Visit: Payer: Self-pay | Admitting: Obstetrics and Gynecology

## 2022-01-05 DIAGNOSIS — M545 Low back pain, unspecified: Secondary | ICD-10-CM

## 2022-01-05 MED ORDER — MELOXICAM 15 MG PO TABS
15.0000 mg | ORAL_TABLET | Freq: Every day | ORAL | 0 refills | Status: DC | PRN
  Filled 2022-01-05: qty 30, 30d supply, fill #0

## 2022-01-05 NOTE — Progress Notes (Signed)
Meloxicam rx sent ?

## 2022-01-06 DIAGNOSIS — H5203 Hypermetropia, bilateral: Secondary | ICD-10-CM | POA: Diagnosis not present

## 2022-01-06 DIAGNOSIS — Z135 Encounter for screening for eye and ear disorders: Secondary | ICD-10-CM | POA: Diagnosis not present

## 2022-01-06 DIAGNOSIS — H524 Presbyopia: Secondary | ICD-10-CM | POA: Diagnosis not present

## 2022-01-06 DIAGNOSIS — H52223 Regular astigmatism, bilateral: Secondary | ICD-10-CM | POA: Diagnosis not present

## 2022-01-30 ENCOUNTER — Other Ambulatory Visit: Payer: Self-pay

## 2022-02-15 ENCOUNTER — Other Ambulatory Visit: Payer: Self-pay | Admitting: Obstetrics and Gynecology

## 2022-02-15 ENCOUNTER — Other Ambulatory Visit: Payer: Self-pay

## 2022-02-15 DIAGNOSIS — M545 Low back pain, unspecified: Secondary | ICD-10-CM

## 2022-02-15 MED ORDER — MELOXICAM 15 MG PO TABS
15.0000 mg | ORAL_TABLET | Freq: Every day | ORAL | 11 refills | Status: DC | PRN
  Filled 2022-02-15: qty 30, 30d supply, fill #0
  Filled 2023-01-18: qty 30, 30d supply, fill #1

## 2022-02-15 NOTE — Progress Notes (Signed)
Back XR and melxicam rx sent ?

## 2022-02-16 ENCOUNTER — Ambulatory Visit
Admission: RE | Admit: 2022-02-16 | Discharge: 2022-02-16 | Disposition: A | Discharge status: Home / Self Care | Payer: 59 | Source: Ambulatory Visit | Attending: Obstetrics and Gynecology | Admitting: Obstetrics and Gynecology

## 2022-02-16 ENCOUNTER — Ambulatory Visit
Admission: RE | Admit: 2022-02-16 | Discharge: 2022-02-16 | Disposition: A | Discharge status: Home / Self Care | Payer: 59 | Attending: Obstetrics and Gynecology | Admitting: Obstetrics and Gynecology

## 2022-02-16 DIAGNOSIS — M545 Low back pain, unspecified: Secondary | ICD-10-CM | POA: Insufficient documentation

## 2022-04-27 ENCOUNTER — Other Ambulatory Visit: Payer: Self-pay

## 2022-07-03 ENCOUNTER — Ambulatory Visit: Payer: 59 | Admitting: Obstetrics & Gynecology

## 2022-07-17 ENCOUNTER — Encounter: Payer: Self-pay | Admitting: Obstetrics & Gynecology

## 2022-07-17 ENCOUNTER — Ambulatory Visit (INDEPENDENT_AMBULATORY_CARE_PROVIDER_SITE_OTHER): Payer: 59 | Admitting: Obstetrics & Gynecology

## 2022-07-17 VITALS — BP 132/86 | HR 68 | Ht 67.0 in | Wt 154.0 lb

## 2022-07-17 DIAGNOSIS — Z01419 Encounter for gynecological examination (general) (routine) without abnormal findings: Secondary | ICD-10-CM | POA: Diagnosis not present

## 2022-07-17 NOTE — Progress Notes (Signed)
Subjective:    Cynthia Haas is a 51 y.o. P81 (6 yo daughter) who presents for an annual exam. The patient has no complaints today. The patient is sexually active. GYN screening history: last pap: was normal. The patient wears seatbelts: yes. The patient participates in regular exercise: yes Has the patient ever been transfused or tattooed?: no. The patient reports that there is not domestic violence in her life.   Menstrual History: OB History     Gravida  1   Para  1   Term  1   Preterm      AB      Living  1      SAB      IAB      Ectopic      Multiple      Live Births  1            Patient's last menstrual period was 07/03/2022 (exact date). Period Pattern: (!) Irregular Menstrual Flow: Light Menstrual Control: Tampon, Maxi pad, Thin pad Menstrual Control Change Freq (Hours): 6 Dysmenorrhea: None  The following portions of the patient's history were reviewed and updated as appropriate: allergies, current medications, past family history, past medical history, past social history, past surgical history, and problem list.  Review of Systems Pertinent items are noted in HPI.  Negative BRCA1 & 2/full MyRisk panel    Objective:    BP 132/86   Pulse 68   Ht _0  (1.702 m)   Wt 154 lb (69.9 kg)   LMP 07/03/2022 (Exact Date)   BMI 24.12 kg/m   General Appearance:    Alert, cooperative, no distress, appears stated age  Head:    Normocephalic, without obvious abnormality, atraumatic  Eyes:    PERRL, conjunctiva/corneas clear, EOM's intact, fundi    benign, both eyes  Ears:    Normal TM's and external ear canals, both ears  Nose:   Nares normal, septum midline, mucosa normal, no drainage    or sinus tenderness  Throat:   Lips, mucosa, and tongue normal; teeth and gums normal  Neck:   Supple, symmetrical, trachea midline, no adenopathy;    thyroid:  no enlargement/tenderness/nodules; no carotid   bruit or JVD  Back:     Symmetric, no curvature, ROM  normal, no CVA tenderness  Lungs:     Clear to auscultation bilaterally, respirations unlabored  Chest Wall:    No tenderness or deformity   Heart:    Regular rate and rhythm, S1 and S2 normal, no murmur, rub   or gallop  Breast Exam:    No tenderness, masses, or nipple abnormality  Abdomen:     Soft, non-tender, bowel sounds active all four quadrants,    no masses, no organomegaly  Genitalia:    Normal female without lesion, discharge or tenderness, normal size and shape, midplane, mobile, non-tender, normal adnexal exam      Extremities:   Extremities normal, atraumatic, no cyanosis or edema  Pulses:   2+ and symmetric all extremities  Skin:   Skin color, texture, turgor normal, no rashes or lesions  Lymph nodes:   Cervical, supraclavicular, and axillary nodes normal  Neurologic:   CNII-XII intact, normal strength, sensation and reflexes    throughout  .    Assessment:    Healthy female exam.    Plan:     Labs today  Colonoscopy with GI referral

## 2022-07-20 ENCOUNTER — Telehealth: Payer: Self-pay

## 2022-07-20 ENCOUNTER — Other Ambulatory Visit: Payer: Self-pay

## 2022-07-20 DIAGNOSIS — Z1211 Encounter for screening for malignant neoplasm of colon: Secondary | ICD-10-CM

## 2022-07-20 MED ORDER — SUTAB 1479-225-188 MG PO TABS
12.0000 | ORAL_TABLET | Freq: Once | ORAL | 0 refills | Status: AC
  Filled 2022-07-20 – 2022-10-23 (×2): qty 24, 1d supply, fill #0

## 2022-07-20 NOTE — Telephone Encounter (Signed)
Gastroenterology Pre-Procedure Review  Request Date: 11/17/2021 Requesting Physician: Dr. Marius Ditch   Insurance will stay the same.   PATIENT REVIEW QUESTIONS: The patient responded to the following health history questions as indicated:    1. Are you having any GI issues? no 2. Do you have a personal history of Polyps? no 3. Do you have a family history of Colon Cancer or Polyps? no 4. Diabetes Mellitus? no 5. Joint replacements in the past 12 months?no 6. Major health problems in the past 3 months?no 7. Any artificial heart valves, MVP, or defibrillator?no    MEDICATIONS & ALLERGIES:    Patient reports the following regarding taking any anticoagulation/antiplatelet therapy:   Plavix, Coumadin, Eliquis, Xarelto, Lovenox, Pradaxa, Brilinta, or Effient? no Aspirin? no  Patient confirms/reports the following medications:  Current Outpatient Medications  Medication Sig Dispense Refill   Cholecalciferol (VITAMIN D3) 3000 UNITS TABS Take by mouth.     etonogestrel-ethinyl estradiol (NUVARING) 0.12-0.015 MG/24HR vaginal ring Insert vaginally and leave in place for 3 consecutive weeks, then remove for 1 week. 1 each 12   etonogestrel-ethinyl estradiol (NUVARING) 0.12-0.015 MG/24HR vaginal ring Insert vaginally and leave in place for 3 consecutive weeks, then remove for 1 week. 3 each 4   meloxicam (MOBIC) 15 MG tablet Take 1 tablet (15 mg total) by mouth daily as needed for pain. 30 tablet 11   No current facility-administered medications for this visit.    Patient confirms/reports the following allergies:  No Known Allergies  No orders of the defined types were placed in this encounter.   AUTHORIZATION INFORMATION Primary Insurance: 1D#: Group #:  Secondary Insurance: 1D#: Group #:  SCHEDULE INFORMATION: Date:  Time: Location:

## 2022-07-24 LAB — COMPREHENSIVE METABOLIC PANEL
ALT: 11 IU/L (ref 0–32)
AST: 19 IU/L (ref 0–40)
Albumin/Globulin Ratio: 1.8 (ref 1.2–2.2)
Albumin: 4.3 g/dL (ref 3.9–4.9)
Alkaline Phosphatase: 62 IU/L (ref 44–121)
BUN/Creatinine Ratio: 11 (ref 9–23)
BUN: 10 mg/dL (ref 6–24)
Bilirubin Total: 0.3 mg/dL (ref 0.0–1.2)
CO2: 22 mmol/L (ref 20–29)
Calcium: 9.4 mg/dL (ref 8.7–10.2)
Chloride: 103 mmol/L (ref 96–106)
Creatinine, Ser: 0.87 mg/dL (ref 0.57–1.00)
Globulin, Total: 2.4 g/dL (ref 1.5–4.5)
Glucose: 87 mg/dL (ref 70–99)
Potassium: 4.1 mmol/L (ref 3.5–5.2)
Sodium: 140 mmol/L (ref 134–144)
Total Protein: 6.7 g/dL (ref 6.0–8.5)
eGFR: 82 mL/min/{1.73_m2} (ref >59)

## 2022-07-24 LAB — TSH: TSH: 3.01 u[IU]/mL (ref 0.450–4.500)

## 2022-07-24 LAB — VITAMIN D 1,25 DIHYDROXY
Vitamin D 1, 25 (OH)2 Total: 82 pg/mL — ABNORMAL HIGH
Vitamin D2 1, 25 (OH)2: <10 pg/mL
Vitamin D3 1, 25 (OH)2: 82 pg/mL

## 2022-07-24 LAB — T4, FREE: Free T4: 1.12 ng/dL (ref 0.82–1.77)

## 2022-07-25 ENCOUNTER — Other Ambulatory Visit: Payer: Self-pay | Admitting: Obstetrics & Gynecology

## 2022-07-25 ENCOUNTER — Other Ambulatory Visit: Payer: Self-pay

## 2022-07-25 DIAGNOSIS — Z3044 Encounter for surveillance of vaginal ring hormonal contraceptive device: Secondary | ICD-10-CM

## 2022-07-25 MED ORDER — ETONOGESTREL-ETHINYL ESTRADIOL 0.12-0.015 MG/24HR VA RING
VAGINAL_RING | VAGINAL | 4 refills | Status: DC
  Filled 2022-07-25: qty 3, 84d supply, fill #0
  Filled 2022-07-25: qty 12, fill #0
  Filled 2022-10-23: qty 3, 84d supply, fill #1
  Filled 2023-01-18: qty 3, 84d supply, fill #2
  Filled 2023-04-16: qty 3, 84d supply, fill #3
  Filled 2023-07-04: qty 3, 84d supply, fill #4

## 2022-07-26 DIAGNOSIS — Z1231 Encounter for screening mammogram for malignant neoplasm of breast: Secondary | ICD-10-CM | POA: Diagnosis not present

## 2022-07-27 ENCOUNTER — Other Ambulatory Visit: Payer: Self-pay

## 2022-09-26 ENCOUNTER — Other Ambulatory Visit: Payer: Self-pay

## 2022-10-23 ENCOUNTER — Other Ambulatory Visit: Payer: Self-pay

## 2022-10-24 ENCOUNTER — Other Ambulatory Visit: Payer: Self-pay

## 2022-11-17 ENCOUNTER — Other Ambulatory Visit: Payer: Self-pay

## 2022-11-17 ENCOUNTER — Encounter
Admission: RE | Disposition: A | Discharge status: Home / Self Care | Payer: Self-pay | Source: Ambulatory Visit | Attending: Gastroenterology

## 2022-11-17 ENCOUNTER — Ambulatory Visit
Admission: RE | Admit: 2022-11-17 | Discharge: 2022-11-17 | Disposition: A | Discharge status: Home / Self Care | Payer: 59 | Source: Ambulatory Visit | Attending: Gastroenterology | Admitting: Gastroenterology

## 2022-11-17 ENCOUNTER — Ambulatory Visit: Payer: 59 | Admitting: Certified Registered"

## 2022-11-17 ENCOUNTER — Encounter: Payer: Self-pay | Admitting: Gastroenterology

## 2022-11-17 DIAGNOSIS — K589 Irritable bowel syndrome without diarrhea: Secondary | ICD-10-CM | POA: Diagnosis not present

## 2022-11-17 DIAGNOSIS — Z1211 Encounter for screening for malignant neoplasm of colon: Secondary | ICD-10-CM | POA: Diagnosis not present

## 2022-11-17 HISTORY — PX: COLONOSCOPY WITH PROPOFOL: SHX5780

## 2022-11-17 SURGERY — COLONOSCOPY WITH PROPOFOL
Anesthesia: General

## 2022-11-17 MED ORDER — PROPOFOL 1000 MG/100ML IV EMUL
INTRAVENOUS | Status: AC
  Filled 2022-11-17: qty 100

## 2022-11-17 MED ORDER — SODIUM CHLORIDE 0.9 % IV SOLN: INTRAVENOUS | Status: DC

## 2022-11-17 MED ORDER — LIDOCAINE HCL (PF) 2 % IJ SOLN
INTRAMUSCULAR | Status: AC
  Filled 2022-11-17: qty 35

## 2022-11-17 MED ORDER — PROPOFOL 500 MG/50ML IV EMUL
INTRAVENOUS | Status: DC | PRN
  Administered 2022-11-17: 200 ug/kg/min via INTRAVENOUS
  Administered 2022-11-17: 100 mg via INTRAVENOUS

## 2022-11-17 MED ORDER — LIDOCAINE HCL (CARDIAC) PF 100 MG/5ML IV SOSY
PREFILLED_SYRINGE | INTRAVENOUS | Status: DC | PRN
Start: 2022-11-17 — End: 2022-11-17
  Administered 2022-11-17: 100 mg via INTRAVENOUS

## 2022-11-17 NOTE — Anesthesia Postprocedure Evaluation (Signed)
Anesthesia Post Note  Patient: Cynthia Haas  Procedure(s) Performed: COLONOSCOPY WITH PROPOFOL  Patient location during evaluation: Endoscopy Anesthesia Type: General Level of consciousness: awake and alert Pain management: pain level controlled Vital Signs Assessment: post-procedure vital signs reviewed and stable Respiratory status: spontaneous breathing, nonlabored ventilation, respiratory function stable and patient connected to nasal cannula oxygen Cardiovascular status: blood pressure returned to baseline and stable Postop Assessment: no apparent nausea or vomiting Anesthetic complications: no   There were no known notable events for this encounter.   Last Vitals:  Vitals:   11/17/22 0820 11/17/22 0830  BP: 114/76 124/89  Pulse: 91 84  Resp: (!) 22 10  Temp: 36.4 C   SpO2: 100% 100%    Last Pain:  Vitals:   11/17/22 0830  TempSrc:   PainSc: 0-No pain                 Ilene Qua

## 2022-11-17 NOTE — OR Nursing (Signed)
POC urine pregnancy result negative

## 2022-11-17 NOTE — Transfer of Care (Signed)
Immediate Anesthesia Transfer of Care Note  Patient: Cynthia Haas  Procedure(s) Performed: COLONOSCOPY WITH PROPOFOL  Patient Location: PACU  Anesthesia Type:General  Level of Consciousness: awake, alert , and oriented  Airway & Oxygen Therapy: Patient connected to nasal cannula oxygen  Post-op Assessment: Report given to RN and Post -op Vital signs reviewed and stable  Post vital signs: stable  Last Vitals:  Vitals Value Taken Time  BP 114/76 11/17/22 0820  Temp 36.4 C 11/17/22 0820  Pulse 91 11/17/22 0820  Resp 22 11/17/22 0820  SpO2 100 % 11/17/22 0820    Last Pain:  Vitals:   11/17/22 0820  TempSrc: Temporal  PainSc:          Complications: No notable events documented.

## 2022-11-17 NOTE — Op Note (Signed)
[facility] Gastroenterology Patient Name: Cynthia Haas Procedure Date: 11/17/2022 7:19 AM MRN: [id number] Account #: [id number] Date of Birth: 12-Mar-1971 Admit Type: Outpatient Age: 52 Room: [facility] ENDO ROOM 1 Gender: Female Note Status: Finalized Instrument Name: Colonoscope [id number] Procedure:             Colonoscopy Indications:           Screening for colorectal malignant neoplasm Providers:             Lin Landsman MD, MD Medicines:             General Anesthesia Complications:         No immediate complications. Estimated blood loss: None. Procedure:             Pre-Anesthesia Assessment:                        - Prior to the procedure, a History and Physical was                         performed, and patient medications and allergies were                         reviewed. The patient is competent. The risks and                         benefits of the procedure and the sedation options and                         risks were discussed with the patient. All questions                         were answered and informed consent was obtained.                         Patient identification and proposed procedure were                         verified by the physician, the nurse, the                         anesthesiologist, the anesthetist and the technician                         in the pre-procedure area in the procedure room in the                         endoscopy suite. Mental Status Examination: alert and                         oriented. Airway Examination: normal oropharyngeal                         airway and neck mobility. Respiratory Examination:                         clear to auscultation. CV Examination: normal.                         Prophylactic Antibiotics: The  patient does not require                         prophylactic antibiotics. Prior Anticoagulants: The                         patient has taken no anticoagulant or antiplatelet                          agents. ASA Grade Assessment: II - A patient with mild                         systemic disease. After reviewing the risks and                         benefits, the patient was deemed in satisfactory                         condition to undergo the procedure. The anesthesia                         plan was to use general anesthesia. Immediately prior                         to administration of medications, the patient was                         re-assessed for adequacy to receive sedatives. The                         heart rate, respiratory rate, oxygen saturations,                         blood pressure, adequacy of pulmonary ventilation, and                         response to care were monitored throughout the                         procedure. The physical status of the patient was                         re-assessed after the procedure.                        After obtaining informed consent, the colonoscope was                         passed under direct vision. Throughout the procedure,                         the patient's blood pressure, pulse, and oxygen                         saturations were monitored continuously. The                         Colonoscope was introduced through the anus and  advanced to the the cecum, identified by appendiceal                         orifice and ileocecal valve. The colonoscopy was                         performed without difficulty. The patient tolerated                         the procedure well. The quality of the bowel                         preparation was evaluated using the BBPS [facility] Bowel                         Preparation Scale) with scores of: Right Colon = 3,                         Transverse Colon = 3 and Left Colon = 3 (entire mucosa                         seen well with no residual staining, small fragments                         of stool or opaque liquid). The total BBPS score                          equals 9. The ileocecal valve, appendiceal orifice,                         and rectum were photographed. Findings:      The perianal and digital rectal examinations were normal. Pertinent       negatives include normal sphincter tone and no palpable rectal lesions.      The entire examined colon appeared normal.      The retroflexed view of the distal rectum and anal verge was normal and       showed no anal or rectal abnormalities. Impression:            - The entire examined colon is normal.                        - The distal rectum and anal verge are normal on                         retroflexion view.                        - No specimens collected. Recommendation:        - Discharge patient to home (with escort).                        - Resume previous diet today.                        - Continue present medications.                        - Repeat colonoscopy in  10 years for screening                         purposes. Procedure Code(s):     --- Professional ---                        J6734, Colorectal cancer screening; colonoscopy on                         individual not meeting criteria for high risk Diagnosis Code(s):     --- Professional ---                        Z12.11, Encounter for screening for malignant neoplasm                         of colon CPT copyright 2022 American Medical Association. All rights reserved. The codes documented in this report are preliminary and upon coder review may  be revised to meet current compliance requirements. Dr. Ulyess Mort Lin Landsman MD, MD 11/17/2022 8:19:20 AM This report has been signed electronically. Number of Addenda: 0 Note Initiated On: 11/17/2022 7:19 AM Scope Withdrawal Time: 0 hours 9 minutes 31 seconds  Total Procedure Duration: 0 hours 19 minutes 1 second  Estimated Blood Loss:  Estimated blood loss: none.      [facility]

## 2022-11-17 NOTE — H&P (Signed)
Cephas Darby, MD [address]  [place]  [place], [place] [address]  Main: [phone]  Fax: [phone] Pager: [phone]  Primary Care Physician:  [facility], [place] Primary Gastroenterologist:  Dr. Cephas Darby  Pre-Procedure History & Physical: HPI:  Cynthia Haas is a 52 y.o. female is here for an colonoscopy.   Past Medical History:  Diagnosis Date   Acne    Family history of breast cancer    mom was BRCA 1/2 neg. Patient is MYRISK negative. Lifetime risk 29.2%   Genetic testing of female 06/2013   My Risk neg   History of mammogram 05/01/2012   BIRADS1 @ [facility] 11/25/15; BIRAD2 cAT 2 BREAST mri 10/16; BIRAD2 06/06/16 [facility] MRI 11/3 BENIGN   History of Papanicolaou smear of cervix 03/22/2012   neg   Increased risk of breast cancer 2014   last mammogram 06/22/17 neg; last breast MRI-11/16/2017   Irritable bowel disease    Seasonal allergies     Past Surgical History:  Procedure Laterality Date   BREAST BIOPSY  [id number]   BREAST BIOPSY Right 06/19/2013   Procedure:  RIGHT BREAST EXCISION WITH NEEDLE LOCALIZATION;  Surgeon: Rolm Bookbinder, MD;  Location: [place];  Service: General;  Laterality: Right;   IR SCLEROTHERAPY OF A FLUID COLLECTION  2016   MRI     WISDOM TOOTH EXTRACTION      Prior to Admission medications   Medication Sig Start Date End Date Taking? Authorizing Provider  Cholecalciferol (VITAMIN D3) 3000 UNITS TABS Take by mouth.   Yes [provider]  etonogestrel-ethinyl estradiol (NUVARING) 0.12-0.015 MG/24HR vaginal ring Insert vaginally and leave in place for a 4 weeks. Use continuously. 07/25/22  Yes Dove, Myra C, MD  meloxicam (MOBIC) 15 MG tablet Take 1 tablet (15 mg total) by mouth daily as needed for pain. 02/15/22  Yes Homero Fellers, MD    Allergies as of 07/20/2022   (No Known Allergies)    Family History  Problem Relation Age of Onset   Cancer Mother 54       breast, recurrence at age  70   Hyperlipidemia Mother    Thyroid disease Mother        hypothyroidism   Hypertension Father    Thyroid disease Maternal Grandmother    Heart disease Maternal Grandfather    Hypertension Maternal Grandfather    Prostate cancer Maternal Grandfather 62   Hypertension Paternal Grandmother    Breast cancer Other 66    Social History   Socioeconomic History   Marital status: Married    Spouse name: Corene Cornea   Number of children: 1   Years of education: 47   Highest education level: Not on file  Occupational History   Occupation: Librarian, academic  Tobacco Use   Smoking status: Never   Smokeless tobacco: Never  Vaping Use   Vaping Use: Never used  Substance and Sexual Activity   Alcohol use: Yes    Comment: occasionally   Drug use: No   Sexual activity: Yes    Partners: Female    Birth control/protection: Inserts    Comment: Nuvaring  Other Topics Concern   Not on file  Social History Narrative   Not on file   Social Determinants of Health   Financial Resource Strain: Not on file  Food Insecurity: Not on file  Transportation Needs: Not on file  Physical Activity: Not on file  Stress: Not on file  Social Connections: Not on file  Intimate Partner Violence: Not on file    Review of Systems: See HPI, otherwise negative ROS  Physical Exam: BP (!) 140/94   Pulse 85   Temp (!) 97.2 F (36.2 C) (Temporal)   Resp 18   Ht '5\' 7"'$  (1.702 m)   Wt 70.8 kg   SpO2 100%   BMI 24.43 kg/m  General:   Alert,  pleasant and cooperative in NAD Head:  Normocephalic and atraumatic. Neck:  Supple; no masses or thyromegaly. Lungs:  Clear throughout to auscultation.    Heart:  Regular rate and rhythm. Abdomen:  Soft, nontender and nondistended. Normal bowel sounds, without guarding, and without rebound.   Neurologic:  Alert and  oriented x4;  grossly normal neurologically.  Impression/Plan: Cynthia Haas is here for an colonoscopy to be performed for colon cancer  screening  Risks, benefits, limitations, and alternatives regarding  colonoscopy have been reviewed with the patient.  Questions have been answered.  All parties agreeable.   Sherri Sear, MD  11/17/2022, 7:47 AM

## 2022-11-17 NOTE — Anesthesia Preprocedure Evaluation (Signed)
Anesthesia Evaluation  Patient identified by MRN, date of birth, ID band Patient awake    Reviewed: Allergy & Precautions, NPO status , Patient's Chart, lab work & pertinent test results  History of Anesthesia Complications Negative for: history of anesthetic complications  Airway Mallampati: II  TM Distance: >3 FB Neck ROM: full    Dental no notable dental hx.    Pulmonary neg pulmonary ROS   Pulmonary exam normal        Cardiovascular negative cardio ROS Normal cardiovascular exam     Neuro/Psych negative neurological ROS  negative psych ROS   GI/Hepatic negative GI ROS, Neg liver ROS,,,  Endo/Other  negative endocrine ROS    Renal/GU negative Renal ROS  negative genitourinary   Musculoskeletal   Abdominal   Peds  Hematology negative hematology ROS (+)   Anesthesia Other Findings Past Medical History: No date: Acne No date: Family history of breast cancer     Comment:  mom was BRCA 1/2 neg. Patient is MYRISK negative.               Lifetime risk 29.2% 06/2013: Genetic testing of female     Comment:  My Risk neg 05/01/2012: History of mammogram     Comment:  BIRADS1 @ [facility] 11/25/15; BIRAD2 cAT 2 BREAST mri 10/16;               BIRAD2 06/06/16 [facility] MRI 11/3 BENIGN 03/22/2012: History of Papanicolaou smear of cervix     Comment:  neg 2014: Increased risk of breast cancer     Comment:  last mammogram 06/22/17 neg; last breast MRI-11/16/2017 No date: Irritable bowel disease No date: Seasonal allergies  Past Surgical History: 072014: BREAST BIOPSY 06/19/2013: BREAST BIOPSY; Right     Comment:  Procedure:  RIGHT BREAST EXCISION WITH NEEDLE               LOCALIZATION;  Surgeon: Rolm Bookbinder, MD;  Location:              [place];  Service: General;                Laterality: Right; 2016: IR SCLEROTHERAPY OF A FLUID COLLECTION No date: MRI No date: WISDOM TOOTH EXTRACTION  BMI    Body  Mass Index: 24.43 kg/m      Reproductive/Obstetrics negative OB ROS                             Anesthesia Physical Anesthesia Plan  ASA: 1  Anesthesia Plan: General   Post-op Pain Management: Minimal or no pain anticipated   Induction: Intravenous  PONV Risk Score and Plan: Propofol infusion and TIVA  Airway Management Planned: Natural Airway and Nasal Cannula  Additional Equipment:   Intra-op Plan:   Post-operative Plan:   Informed Consent: I have reviewed the patients History and Physical, chart, labs and discussed the procedure including the risks, benefits and alternatives for the proposed anesthesia with the patient or authorized representative who has indicated his/her understanding and acceptance.     Dental Advisory Given  Plan Discussed with: Anesthesiologist, CRNA and Surgeon  Anesthesia Plan Comments: (Patient consented for risks of anesthesia including but not limited to:  - adverse reactions to medications - risk of airway placement if required - damage to eyes, teeth, lips or other oral mucosa - nerve damage due to positioning  - sore throat or hoarseness - Damage to heart, brain, nerves,  lungs, other parts of body or loss of life  Patient voiced understanding.)        Anesthesia Quick Evaluation

## 2022-11-18 NOTE — Progress Notes (Signed)
Voicemail.  No Message Left. 

## 2022-11-20 ENCOUNTER — Encounter: Payer: Self-pay | Admitting: Gastroenterology

## 2022-11-23 ENCOUNTER — Other Ambulatory Visit: Payer: Self-pay | Admitting: Obstetrics & Gynecology

## 2022-11-23 DIAGNOSIS — Z9189 Other specified personal risk factors, not elsewhere classified: Secondary | ICD-10-CM

## 2022-11-23 DIAGNOSIS — Z803 Family history of malignant neoplasm of breast: Secondary | ICD-10-CM

## 2022-11-23 NOTE — Progress Notes (Signed)
MRI ordered for increased of breast cancer

## 2023-01-10 ENCOUNTER — Ambulatory Visit
Admission: RE | Admit: 2023-01-10 | Discharge: 2023-01-10 | Disposition: A | Discharge status: Home / Self Care | Payer: 59 | Source: Ambulatory Visit | Attending: Obstetrics & Gynecology | Admitting: Obstetrics & Gynecology

## 2023-01-10 DIAGNOSIS — Z1239 Encounter for other screening for malignant neoplasm of breast: Secondary | ICD-10-CM | POA: Diagnosis not present

## 2023-01-10 DIAGNOSIS — Z9189 Other specified personal risk factors, not elsewhere classified: Secondary | ICD-10-CM

## 2023-01-10 DIAGNOSIS — Z803 Family history of malignant neoplasm of breast: Secondary | ICD-10-CM

## 2023-01-10 MED ORDER — GADOPICLENOL 0.5 MMOL/ML IV SOLN
7.0000 mL | Freq: Once | INTRAVENOUS | Status: AC | PRN
  Administered 2023-01-10: 7 mL via INTRAVENOUS

## 2023-01-18 ENCOUNTER — Other Ambulatory Visit: Payer: Self-pay

## 2023-01-30 ENCOUNTER — Ambulatory Visit (INDEPENDENT_AMBULATORY_CARE_PROVIDER_SITE_OTHER): Payer: 59

## 2023-01-30 DIAGNOSIS — Z23 Encounter for immunization: Secondary | ICD-10-CM | POA: Diagnosis not present

## 2023-04-16 ENCOUNTER — Other Ambulatory Visit: Payer: Self-pay

## 2023-05-18 ENCOUNTER — Other Ambulatory Visit: Payer: Self-pay

## 2023-05-18 ENCOUNTER — Other Ambulatory Visit: Payer: Self-pay | Admitting: Obstetrics

## 2023-05-18 DIAGNOSIS — B3731 Acute candidiasis of vulva and vagina: Secondary | ICD-10-CM

## 2023-05-18 DIAGNOSIS — Z2989 Encounter for other specified prophylactic measures: Secondary | ICD-10-CM

## 2023-05-18 MED ORDER — CIPROFLOXACIN HCL 500 MG PO TABS
500.0000 mg | ORAL_TABLET | Freq: Two times a day (BID) | ORAL | 0 refills | Status: DC
  Filled 2023-05-18: qty 14, 7d supply, fill #0

## 2023-05-18 MED ORDER — FLUCONAZOLE 150 MG PO TABS
150.0000 mg | ORAL_TABLET | Freq: Once | ORAL | 0 refills | Status: AC | PRN
  Filled 2023-05-18: qty 2, 3d supply, fill #0

## 2023-05-18 NOTE — Progress Notes (Signed)
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 Request for diflucan tabs and Cipro to have on hand for this trip. I have sent in a Rx to her [facility] pharmacy.  Mirna Mires, CNM  05/18/2023 12:37 PM

## 2023-07-06 ENCOUNTER — Other Ambulatory Visit: Payer: Self-pay

## 2023-07-23 ENCOUNTER — Ambulatory Visit: Payer: 59 | Admitting: Obstetrics & Gynecology

## 2023-07-30 ENCOUNTER — Other Ambulatory Visit: Payer: Self-pay

## 2023-07-30 ENCOUNTER — Encounter: Payer: Self-pay | Admitting: Obstetrics & Gynecology

## 2023-07-30 ENCOUNTER — Other Ambulatory Visit (HOSPITAL_COMMUNITY)
Admission: RE | Admit: 2023-07-30 | Discharge: 2023-07-30 | Disposition: A | Discharge status: Home / Self Care | Payer: 59 | Source: Ambulatory Visit | Attending: Obstetrics & Gynecology | Admitting: Obstetrics & Gynecology

## 2023-07-30 ENCOUNTER — Ambulatory Visit (INDEPENDENT_AMBULATORY_CARE_PROVIDER_SITE_OTHER): Payer: 59 | Admitting: Obstetrics & Gynecology

## 2023-07-30 VITALS — BP 124/81 | HR 76 | Ht 67.0 in | Wt 155.0 lb

## 2023-07-30 DIAGNOSIS — Z124 Encounter for screening for malignant neoplasm of cervix: Secondary | ICD-10-CM

## 2023-07-30 DIAGNOSIS — Z01419 Encounter for gynecological examination (general) (routine) without abnormal findings: Secondary | ICD-10-CM

## 2023-07-30 DIAGNOSIS — Z0000 Encounter for general adult medical examination without abnormal findings: Secondary | ICD-10-CM

## 2023-07-30 DIAGNOSIS — Z3044 Encounter for surveillance of vaginal ring hormonal contraceptive device: Secondary | ICD-10-CM

## 2023-07-30 MED ORDER — ETONOGESTREL-ETHINYL ESTRADIOL 0.12-0.015 MG/24HR VA RING
VAGINAL_RING | VAGINAL | 4 refills | Status: DC
  Filled 2023-07-30: qty 3, fill #0
  Filled 2023-09-26: qty 3, 84d supply, fill #0
  Filled 2023-12-19: qty 3, 84d supply, fill #1
  Filled 2024-03-09: qty 3, 84d supply, fill #2
  Filled 2024-05-31: qty 3, 84d supply, fill #3

## 2023-07-30 NOTE — Progress Notes (Signed)
GYNECOLOGY ANNUAL PHYSICAL EXAM PROGRESS NOTE  Subjective:    Cynthia Haas is a 52 y.o. married G1P1001 (25 yo daughter)  who presents for an annual exam.  The patient is sexually active. The patient participates in regular exercise: yes. Has the patient ever been transfused or tattooed?: no. The patient reports that there is not domestic violence in her life.   The patient has the following complaints today:   Menstrual History: Menarche age: 61 Patient's last menstrual period was 07/09/2023. Period Cycle (Days): 60 Period Duration (Days): 7 Period Pattern: Regular Menstrual Flow: Light Dysmenorrhea: None   Gynecologic History:  Contraception: NuvaRing vaginal inserts History of STI's:  Last Pap: 2023. Results were: normal.   Last mammogram: up to date. She gets yearly MRIs also due to high risk of breast cancer Negative genetic testing       OB History  Gravida Para Term Preterm AB Living  1 1 1  0 0 1  SAB IAB Ectopic Multiple Live Births  0 0 0 0 1    # Outcome Date GA Lbr Len/2nd Weight Sex Type Anes PTL Lv  1 Term 11/05/03 [redacted]w[redacted]d  7 lb 15 oz (3.6 kg) F Vag-Spont   LIV     Name: Hailey    Past Medical History:  Diagnosis Date   Acne    Family history of breast cancer    mom was BRCA 1/2 neg. Patient is MYRISK negative. Lifetime risk 29.2%   Genetic testing of female 06/2013   My Risk neg   History of mammogram 05/01/2012   BIRADS1 @ [facility] 11/25/15; BIRAD2 cAT 2 BREAST mri 10/16; BIRAD2 06/06/16 [facility] MRI 11/3 BENIGN   History of Papanicolaou smear of cervix 03/22/2012   neg   Increased risk of breast cancer 2014   last mammogram 06/22/17 neg; last breast MRI-11/16/2017   Irritable bowel disease    Seasonal allergies     Past Surgical History:  Procedure Laterality Date   BREAST BIOPSY  [id number]   BREAST BIOPSY Right 06/19/2013   Procedure:  RIGHT BREAST EXCISION WITH NEEDLE LOCALIZATION;  Surgeon: Emelia Loron, MD;  Location: [facility];  Service: General;  Laterality: Right;   COLONOSCOPY WITH PROPOFOL N/A 11/17/2022   Procedure: COLONOSCOPY WITH PROPOFOL;  Surgeon: Toney Reil, MD;  Location: [facility];  Service: Gastroenterology;  Laterality: N/A;   IR SCLEROTHERAPY OF A FLUID COLLECTION  2016   MRI     WISDOM TOOTH EXTRACTION      Family History  Problem Relation Age of Onset   Cancer Mother 34       breast, recurrence at age 31   Hyperlipidemia Mother    Thyroid disease Mother        hypothyroidism   Hypertension Father    Thyroid disease Maternal Grandmother    Heart disease Maternal Grandfather    Hypertension Maternal Grandfather    Prostate cancer Maternal Grandfather 66   Hypertension Paternal Grandmother    Breast cancer Other 79    Social History   Socioeconomic History   Marital status: Married    Spouse name: Barbara Cower   Number of children: 1   Years of education: 18   Highest education level: Not on file  Occupational History   Occupation: Advice worker  Tobacco Use   Smoking status: Never   Smokeless tobacco: Never  Vaping Use   Vaping status: Never Used  Substance and Sexual Activity   Alcohol use: Yes  Comment: occasionally   Drug use: No   Sexual activity: Yes    Partners: Female    Birth control/protection: Inserts    Comment: Nuvaring  Other Topics Concern   Not on file  Social History Narrative   Not on file   Social Determinants of Health   Financial Resource Strain: Not on file  Food Insecurity: Not on file  Transportation Needs: Not on file  Physical Activity: Not on file  Stress: Not on file  Social Connections: Not on file  Intimate Partner Violence: Not on file    Current Outpatient Medications on File Prior to Visit  Medication Sig Dispense Refill   Cholecalciferol (VITAMIN D3) 3000 UNITS TABS Take by mouth.     etonogestrel-ethinyl estradiol (NUVARING) 0.12-0.015 MG/24HR vaginal ring Insert vaginally and leave in place for a 4  weeks. Use continuously. 3 each 4   meloxicam (MOBIC) 15 MG tablet Take 1 tablet (15 mg total) by mouth daily as needed for pain. 30 tablet 11   ciprofloxacin (CIPRO) 500 MG tablet Take 1 tablet (500 mg total) by mouth 2 (two) times daily. (Patient not taking: Reported on 07/30/2023) 14 tablet 0   fluconazole (DIFLUCAN) 150 MG tablet Take 1 tablet (150 mg total) by mouth once as needed for up to 1 dose. Can take additional dose three days later if symptoms persist (Patient not taking: Reported on 07/30/2023) 2 tablet 0   No current facility-administered medications on file prior to visit.    No Known Allergies   Review of Systems Constitutional: negative for chills, fatigue, fevers and sweats Eyes: negative for irritation, redness and visual disturbance Ears, nose, mouth, throat, and face: negative for hearing loss, nasal congestion, snoring and tinnitus Respiratory: negative for asthma, cough, sputum Cardiovascular: negative for chest pain, dyspnea, exertional chest pressure/discomfort, irregular heart beat, palpitations and syncope Gastrointestinal: negative for abdominal pain, change in bowel habits, nausea and vomiting Genitourinary: negative for abnormal menstrual periods, genital lesions, sexual problems and vaginal discharge, dysuria and urinary incontinence Integument/breast: negative for breast lump, breast tenderness and nipple discharge Hematologic/lymphatic: negative for bleeding and easy bruising Musculoskeletal:negative for back pain and muscle weakness Neurological: negative for dizziness, headaches, vertigo and weakness Endocrine: negative for diabetic symptoms including polydipsia, polyuria and skin dryness Allergic/Immunologic: negative for hay fever and urticaria      Objective:  Blood pressure 124/81, pulse 76, height 5\' 7"  (1.702 m), weight 155 lb (70.3 kg), last menstrual period 07/09/2023. Body mass index is 24.28 kg/m.    General Appearance:    Alert,  cooperative, no distress, appears stated age  Head:    Normocephalic, without obvious abnormality, atraumatic  Eyes:    PERRL, conjunctiva/corneas clear, EOM's intact, both eyes  Ears:    Normal external ear canals, both ears  Nose:   Nares normal, septum midline, mucosa normal, no drainage or sinus tenderness  Throat:   Lips, mucosa, and tongue normal; teeth and gums normal  Neck:   Supple, symmetrical, trachea midline, no adenopathy; thyroid: no enlargement/tenderness/nodules; no carotid bruit or JVD  Back:     Symmetric, no curvature, ROM normal, no CVA tenderness  Lungs:     Clear to auscultation bilaterally, respirations unlabored  Chest Wall:    No tenderness or deformity   Heart:    Regular rate and rhythm, S1 and S2 normal, no murmur, rub or gallop  Breast Exam:    No tenderness, masses, or nipple abnormality, fibrocystic changes in the left breast, upper outer quadrant (no  change from last year)  Abdomen:     Soft, non-tender, bowel sounds active all four quadrants, no masses, no organomegaly.    Genitalia:    Pelvic:external genitalia normal, vagina without lesions, discharge, or tenderness, rectovaginal septum  normal. Cervix normal in appearance, no cervical motion tenderness, no adnexal masses or tenderness.  Uterus normal size, shape, mobile, regular contours, nontender.     Extremities:   Extremities normal, atraumatic, no cyanosis or edema  Pulses:   2+ and symmetric all extremities  Skin:   Skin color, texture, turgor normal, no rashes or lesions  Lymph nodes:   Cervical, supraclavicular, and axillary nodes normal  Neurologic:   CNII-XII intact, normal strength, sensation and reflexes throughout   .  Labs:  Lab Results  Component Value Date   HGB 14.4 06/19/2013    Lab Results  Component Value Date   CREATININE 0.87 07/17/2022   BUN 10 07/17/2022   NA 140 07/17/2022   K 4.1 07/17/2022   CL 103 07/17/2022   CO2 22 07/17/2022    Lab Results  Component Value Date    ALT 11 07/17/2022   AST 19 07/17/2022   ALKPHOS 62 07/17/2022   BILITOT 0.3 07/17/2022    Lab Results  Component Value Date   TSH 3.010 07/17/2022     Assessment:   No diagnosis found.   Plan:  Annual fasting labs at her convenience Pap smear done today Nuvaring prescription refilled Follow up in 1 year for annual exam   Allie Bossier, MD [facility]

## 2023-07-31 ENCOUNTER — Other Ambulatory Visit: Payer: Self-pay

## 2023-07-31 DIAGNOSIS — Z01419 Encounter for gynecological examination (general) (routine) without abnormal findings: Secondary | ICD-10-CM

## 2023-08-01 DIAGNOSIS — Z1231 Encounter for screening mammogram for malignant neoplasm of breast: Secondary | ICD-10-CM | POA: Diagnosis not present

## 2023-08-01 LAB — COMPREHENSIVE METABOLIC PANEL
ALT: 9 IU/L (ref 0–32)
AST: 17 IU/L (ref 0–40)
Albumin: 4.1 g/dL (ref 3.9–4.9)
Alkaline Phosphatase: 54 IU/L (ref 44–121)
BUN/Creatinine Ratio: 13 (ref 9–23)
BUN: 11 mg/dL (ref 6–24)
Bilirubin Total: 0.4 mg/dL (ref 0.0–1.2)
CO2: 21 mmol/L (ref 20–29)
Calcium: 9.3 mg/dL (ref 8.7–10.2)
Chloride: 103 mmol/L (ref 96–106)
Creatinine, Ser: 0.85 mg/dL (ref 0.57–1.00)
Globulin, Total: 2.3 g/dL (ref 1.5–4.5)
Glucose: 91 mg/dL (ref 70–99)
Potassium: 4 mmol/L (ref 3.5–5.2)
Sodium: 139 mmol/L (ref 134–144)
Total Protein: 6.4 g/dL (ref 6.0–8.5)
eGFR: 83 mL/min/{1.73_m2} (ref >59)

## 2023-08-01 LAB — CYTOLOGY - PAP
Adequacy: ABSENT
Comment: NEGATIVE
Diagnosis: NEGATIVE
High risk HPV: NEGATIVE

## 2023-08-01 LAB — CBC
Hematocrit: 38.1 % (ref 34.0–46.6)
Hemoglobin: 12.5 g/dL (ref 11.1–15.9)
MCH: 29.5 pg (ref 26.6–33.0)
MCHC: 32.8 g/dL (ref 31.5–35.7)
MCV: 90 fL (ref 79–97)
Platelets: 282 10*3/uL (ref 150–450)
RBC: 4.24 x10E6/uL (ref 3.77–5.28)
RDW: 12.4 % (ref 11.7–15.4)
WBC: 5 10*3/uL (ref 3.4–10.8)

## 2023-08-01 LAB — VITAMIN D 25 HYDROXY (VIT D DEFICIENCY, FRACTURES): Vit D, 25-Hydroxy: 60.5 ng/mL (ref 30.0–100.0)

## 2023-08-01 LAB — LIPID PANEL
Chol/HDL Ratio: 2.6 ratio (ref 0.0–4.4)
Cholesterol, Total: 181 mg/dL (ref 100–199)
HDL: 69 mg/dL (ref >39)
LDL Chol Calc (NIH): 94 mg/dL (ref 0–99)
Triglycerides: 99 mg/dL (ref 0–149)
VLDL Cholesterol Cal: 18 mg/dL (ref 5–40)

## 2023-08-01 LAB — HEPATITIS C ANTIBODY: Hep C Virus Ab: NONREACTIVE

## 2023-08-01 LAB — HM MAMMOGRAPHY

## 2023-08-01 LAB — TSH+FREE T4
Free T4: 1.05 ng/dL (ref 0.82–1.77)
TSH: 4.69 u[IU]/mL — ABNORMAL HIGH (ref 0.450–4.500)

## 2023-08-07 ENCOUNTER — Other Ambulatory Visit: Payer: Self-pay | Admitting: Obstetrics & Gynecology

## 2023-08-07 DIAGNOSIS — R7989 Other specified abnormal findings of blood chemistry: Secondary | ICD-10-CM

## 2023-08-07 NOTE — Progress Notes (Signed)
Follow up TSH/free T4 ordered

## 2023-08-30 ENCOUNTER — Other Ambulatory Visit: Payer: 59

## 2023-08-30 DIAGNOSIS — R7989 Other specified abnormal findings of blood chemistry: Secondary | ICD-10-CM | POA: Diagnosis not present

## 2023-08-31 LAB — TSH+FREE T4
Free T4: 1.14 ng/dL (ref 0.82–1.77)
TSH: 3.7 u[IU]/mL (ref 0.450–4.500)

## 2023-09-26 ENCOUNTER — Other Ambulatory Visit: Payer: Self-pay

## 2023-11-15 ENCOUNTER — Other Ambulatory Visit: Payer: Self-pay | Admitting: Obstetrics & Gynecology

## 2023-11-15 DIAGNOSIS — Z803 Family history of malignant neoplasm of breast: Secondary | ICD-10-CM

## 2023-11-15 DIAGNOSIS — Z9189 Other specified personal risk factors, not elsewhere classified: Secondary | ICD-10-CM

## 2023-11-15 NOTE — Progress Notes (Signed)
 Breast MRI order placed due to increased risk of breast cancer

## 2023-11-20 ENCOUNTER — Other Ambulatory Visit: Payer: Self-pay | Admitting: Obstetrics & Gynecology

## 2023-11-20 DIAGNOSIS — Z9189 Other specified personal risk factors, not elsewhere classified: Secondary | ICD-10-CM

## 2023-11-20 DIAGNOSIS — Z803 Family history of malignant neoplasm of breast: Secondary | ICD-10-CM

## 2024-01-05 IMAGING — CR DG LUMBAR SPINE COMPLETE 4+V
1 series · 5 of 5 positions shown · non-contrast
Comparison: None.

CLINICAL DATA: Low back pain x 1 year.

EXAM:
LUMBAR SPINE - COMPLETE 4+ VIEW

[Series 1: dg lumbar spine complete 4 +v · 0.14mm/px · 5 of 5 slices shown]
[im 1/5]
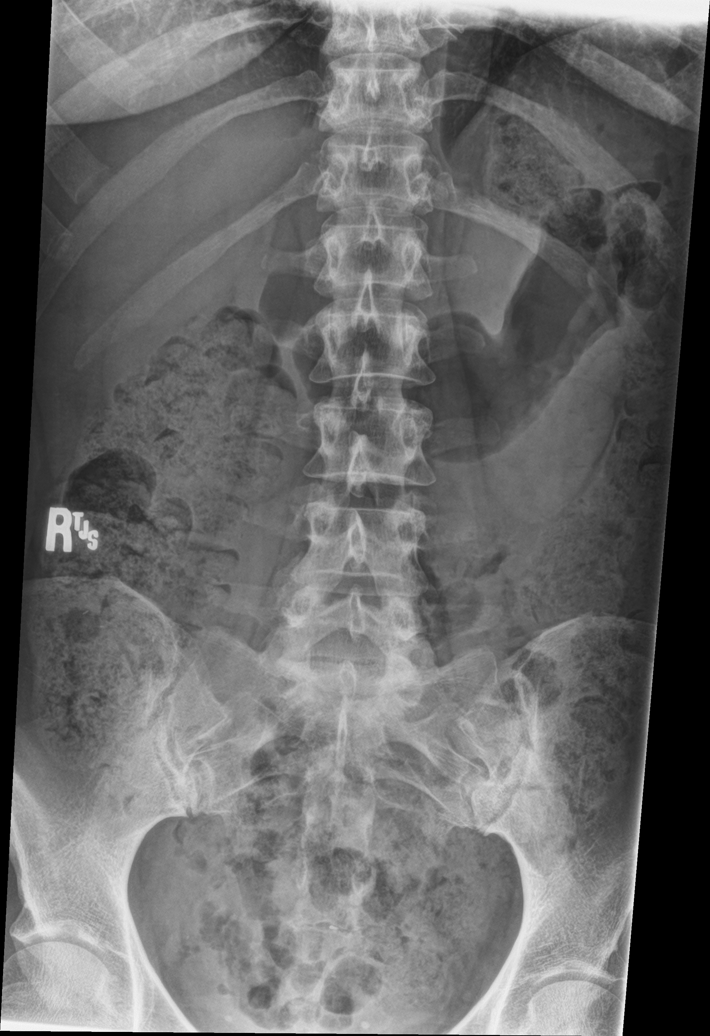
[im 2/5]
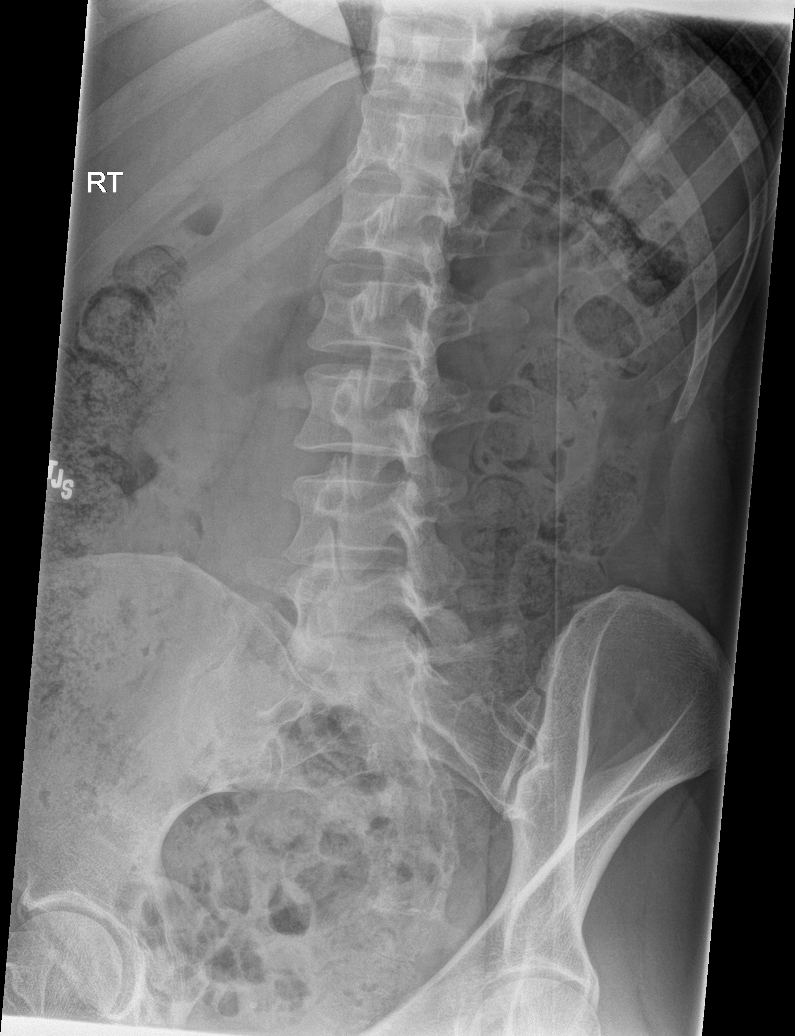
[im 3/5]
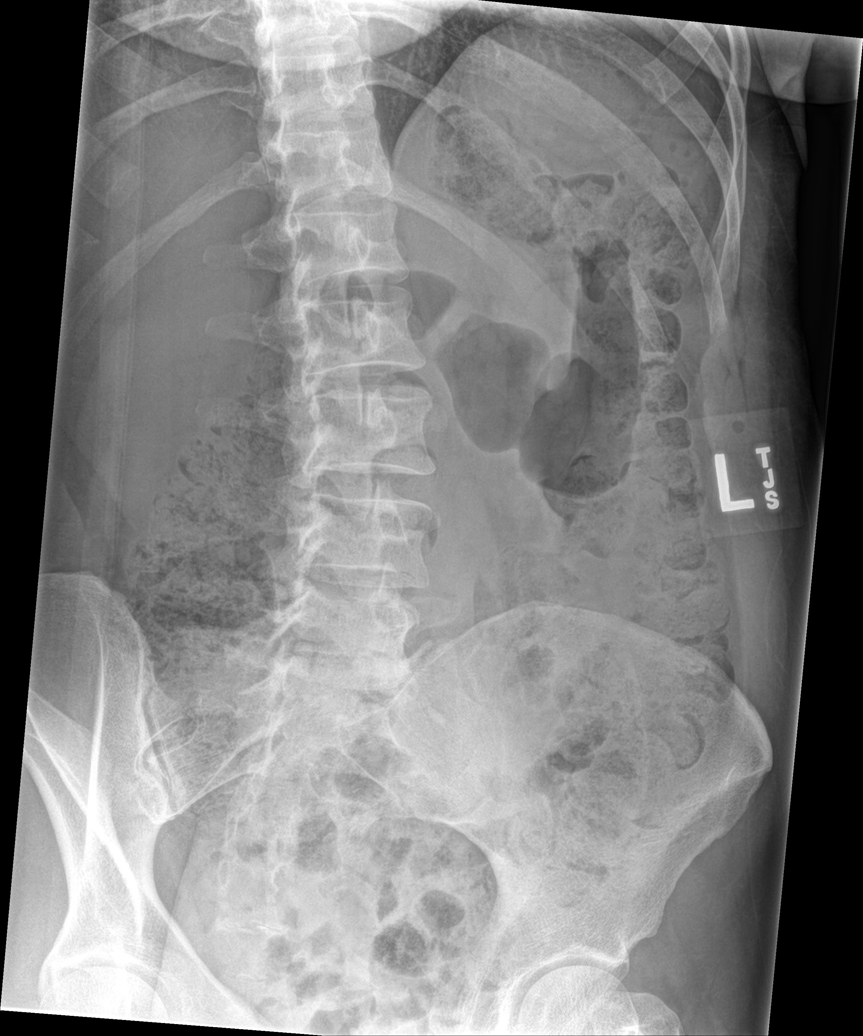
[im 4/5]
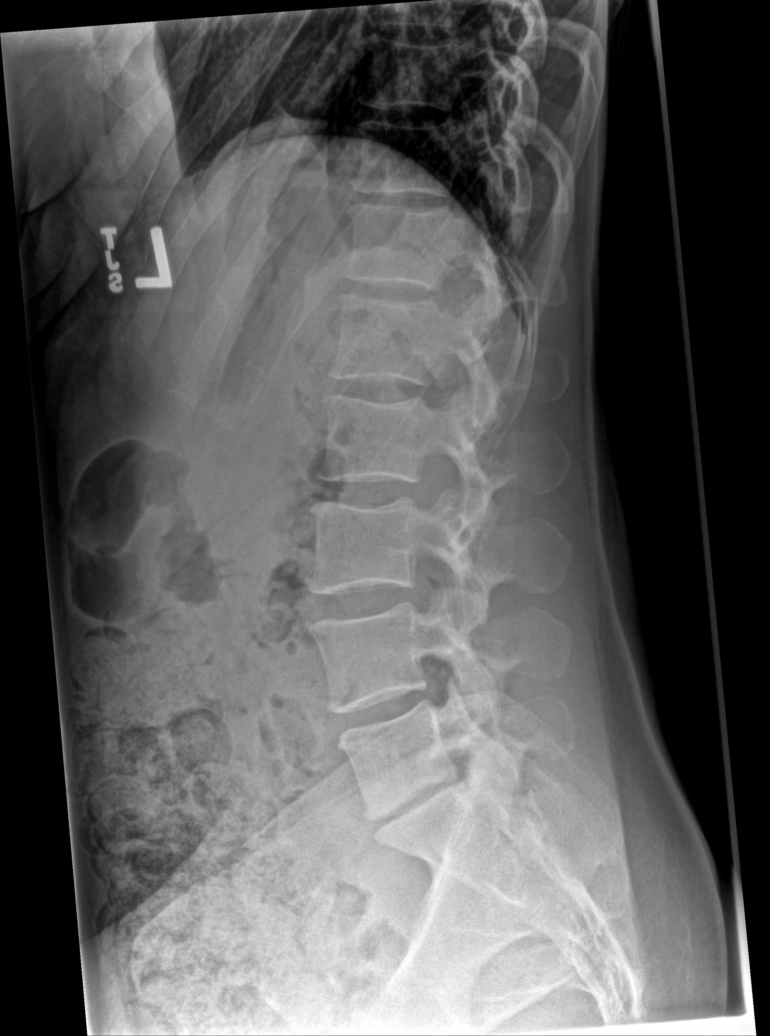
[im 5/5]
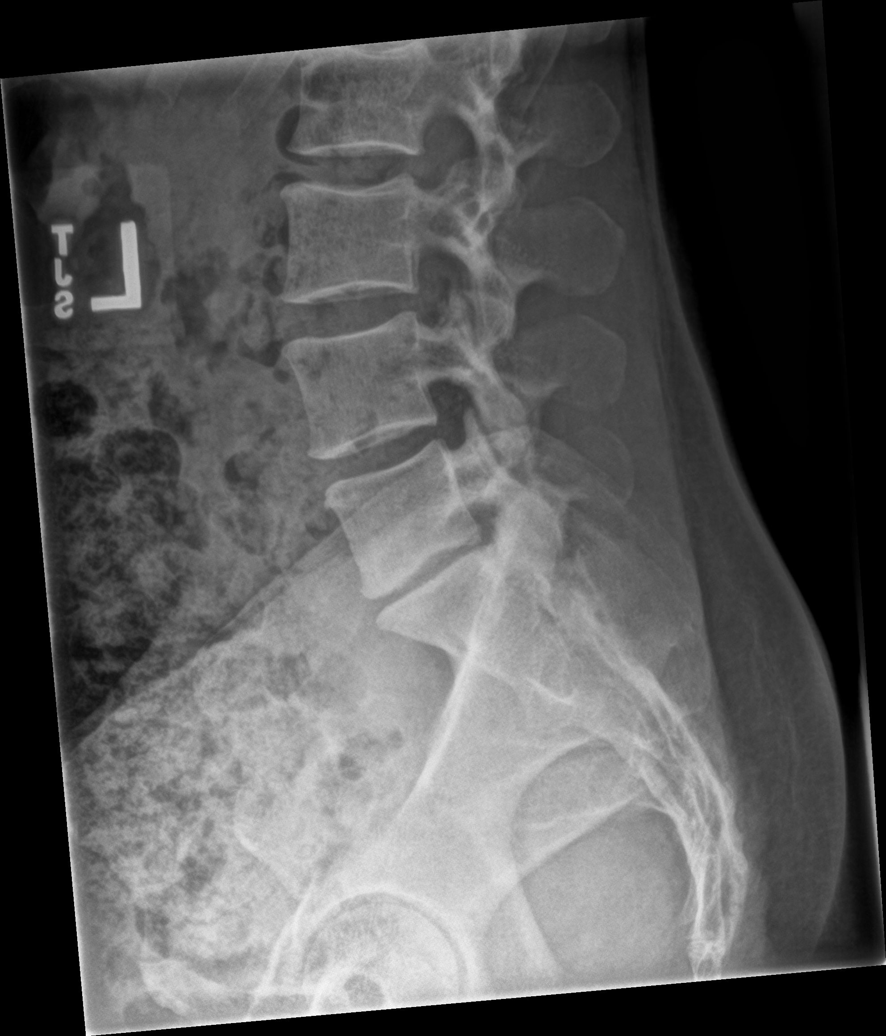

[5 of 5 positions shown; findings below may reference images not displayed]

FINDINGS: No recent fracture is seen. Alignment of posterior margins of
vertebral bodies is within normal limits. There is disc space
narrowing at L4-L5 and L5-S1 levels, more severe at L5-S1 level.
Degenerative changes are noted with small anterior bony spurs and
facet hypertrophy more severe at L5-S1 level. Moderate to large
amount of stool is seen in the colon.
IMPRESSION: No recent fracture is seen. Lumbar spondylosis, more severe at L5-S1
level.

## 2024-01-11 ENCOUNTER — Ambulatory Visit
Admission: RE | Admit: 2024-01-11 | Discharge: 2024-01-11 | Disposition: A | Discharge status: Home / Self Care | Payer: 59 | Source: Ambulatory Visit | Attending: Obstetrics & Gynecology | Admitting: Obstetrics & Gynecology

## 2024-01-11 DIAGNOSIS — Z1239 Encounter for other screening for malignant neoplasm of breast: Secondary | ICD-10-CM | POA: Diagnosis not present

## 2024-01-11 DIAGNOSIS — Z803 Family history of malignant neoplasm of breast: Secondary | ICD-10-CM | POA: Diagnosis not present

## 2024-01-11 DIAGNOSIS — Z9189 Other specified personal risk factors, not elsewhere classified: Secondary | ICD-10-CM

## 2024-01-11 MED ORDER — GADOPICLENOL 0.5 MMOL/ML IV SOLN
7.5000 mL | Freq: Once | INTRAVENOUS | Status: AC | PRN
  Administered 2024-01-11: 7.5 mL via INTRAVENOUS

## 2024-02-25 ENCOUNTER — Other Ambulatory Visit (HOSPITAL_BASED_OUTPATIENT_CLINIC_OR_DEPARTMENT_OTHER): Payer: Self-pay

## 2024-03-05 DIAGNOSIS — H524 Presbyopia: Secondary | ICD-10-CM | POA: Diagnosis not present

## 2024-05-26 ENCOUNTER — Other Ambulatory Visit: Payer: Self-pay | Admitting: Obstetrics

## 2024-05-26 DIAGNOSIS — Z1283 Encounter for screening for malignant neoplasm of skin: Secondary | ICD-10-CM

## 2024-06-01 ENCOUNTER — Other Ambulatory Visit: Payer: Self-pay

## 2024-06-02 ENCOUNTER — Other Ambulatory Visit: Payer: Self-pay

## 2024-07-28 ENCOUNTER — Other Ambulatory Visit

## 2024-07-28 ENCOUNTER — Other Ambulatory Visit: Payer: Self-pay | Admitting: Obstetrics & Gynecology

## 2024-07-28 DIAGNOSIS — Z0000 Encounter for general adult medical examination without abnormal findings: Secondary | ICD-10-CM | POA: Diagnosis not present

## 2024-07-29 LAB — TSH+FREE T4
Free T4: 1.01 ng/dL (ref 0.82–1.77)
TSH: 2.72 u[IU]/mL (ref 0.450–4.500)

## 2024-08-01 DIAGNOSIS — Z1231 Encounter for screening mammogram for malignant neoplasm of breast: Secondary | ICD-10-CM | POA: Diagnosis not present

## 2024-08-01 LAB — HM MAMMOGRAPHY

## 2024-08-04 ENCOUNTER — Ambulatory Visit: Admitting: Obstetrics & Gynecology

## 2024-08-05 ENCOUNTER — Ambulatory Visit (INDEPENDENT_AMBULATORY_CARE_PROVIDER_SITE_OTHER): Admitting: Obstetrics & Gynecology

## 2024-08-05 VITALS — BP 119/84 | HR 73 | Wt 152.0 lb

## 2024-08-05 DIAGNOSIS — Z0000 Encounter for general adult medical examination without abnormal findings: Secondary | ICD-10-CM

## 2024-08-05 DIAGNOSIS — Z9189 Other specified personal risk factors, not elsewhere classified: Secondary | ICD-10-CM | POA: Diagnosis not present

## 2024-08-05 DIAGNOSIS — Z01419 Encounter for gynecological examination (general) (routine) without abnormal findings: Secondary | ICD-10-CM | POA: Diagnosis not present

## 2024-08-05 DIAGNOSIS — Z803 Family history of malignant neoplasm of breast: Secondary | ICD-10-CM

## 2024-08-05 NOTE — Progress Notes (Addendum)
 GYNECOLOGY ANNUAL PHYSICAL EXAM PROGRESS NOTE  Subjective:    Cynthia Haas is a 53 y.o. married  G1P1001 (88 yo daughter in [place]  [place] in [place]) who presents for an annual exam.  The patient is sexually active. The patient participates in regular exercise: yes. Has the patient ever been transfused or tattooed?: no. The patient reports that there is not domestic violence in her life.   The patient has the following complaints today: She is happy with Nuvaring, some hot flashes/night sweats.   Menstrual History: Menarche age: 35 Patient's last menstrual period was 07/29/2024 (exact date).     Gynecologic History:  Contraception: NuvaRing vaginal inserts History of STI's:  Last Pap: 2024. Results were: normal.  Deniesh/o abnormal pap smears. Last mammogram: 07/2024. Results were: normal. She is high risk so she gets yearly MRIs but may switch to CT contrast mammos in the future. She had negative MyRisk genetic testing but has lifetime risk around 30%.  Colonoscopy negative and due again 9 years.    OB History  Gravida Para Term Preterm AB Living  1 1 1  0 0 1  SAB IAB Ectopic Multiple Live Births  0 0 0 0 1    # Outcome Date GA Lbr Len/2nd Weight Sex Type Anes PTL Lv  1 Term 11/05/03 [redacted]w[redacted]d  7 lb 15 oz (3.6 kg) F Vag-Spont   LIV     Name: Cynthia Haas    Past Medical History:  Diagnosis Date   Acne    Family history of breast cancer    mom was BRCA 1/2 neg. Patient is MYRISK negative. Lifetime risk 29.2%   Genetic testing of female 06/2013   My Risk neg   History of mammogram 05/01/2012   BIRADS1 @ [facility] 11/25/15; BIRAD2 cAT 2 BREAST mri 10/16; BIRAD2 06/06/16 [facility] MRI 11/3 BENIGN   History of Papanicolaou smear of cervix 03/22/2012   neg   Increased risk of breast cancer 2014   last mammogram 06/22/17 neg; last breast MRI-11/16/2017   Irritable bowel disease    Seasonal allergies     Past Surgical History:  Procedure Laterality Date   BREAST BIOPSY  [id number]   BREAST  BIOPSY Right 06/19/2013   Procedure:  RIGHT BREAST EXCISION WITH NEEDLE LOCALIZATION;  Surgeon: Donnice Bury, MD;  Location: [facility];  Service: General;  Laterality: Right;   COLONOSCOPY WITH PROPOFOL  N/A 11/17/2022   Procedure: COLONOSCOPY WITH PROPOFOL ;  Surgeon: Unk Corinn Skiff, MD;  Location: [facility];  Service: Gastroenterology;  Laterality: N/A;   IR SCLEROTHERAPY OF A FLUID COLLECTION  2016   MRI     WISDOM TOOTH EXTRACTION      Family History  Problem Relation Age of Onset   Cancer Mother 20       breast, recurrence at age 8   Hyperlipidemia Mother    Thyroid  disease Mother        hypothyroidism   Hypertension Father    Thyroid  disease Maternal Grandmother    Heart disease Maternal Grandfather    Hypertension Maternal Grandfather    Prostate cancer Maternal Grandfather 37   Hypertension Paternal Grandmother    Breast cancer Other 103    Social History   Socioeconomic History   Marital status: Married    Spouse name: Selinda   Number of children: 1   Years of education: 18   Highest education level: Not on file  Occupational History   Occupation: physician assistant  Tobacco Use   Smoking status:  Never   Smokeless tobacco: Never  Vaping Use   Vaping status: Never Used  Substance and Sexual Activity   Alcohol use: Yes    Comment: occasionally   Drug use: No   Sexual activity: Yes    Partners: Female    Birth control/protection: Inserts    Comment: Nuvaring  Other Topics Concern   Not on file  Social History Narrative   Not on file   Social Drivers of Health   Financial Resource Strain: Not on file  Food Insecurity: Not on file  Transportation Needs: Not on file  Physical Activity: Not on file  Stress: Not on file  Social Connections: Not on file  Intimate Partner Violence: Not on file    Current Outpatient Medications on File Prior to Visit  Medication Sig Dispense Refill   Cholecalciferol (VITAMIN D3) 3000 UNITS  TABS Take by mouth.     etonogestrel -ethinyl estradiol  (NUVARING) 0.12-0.015 MG/24HR vaginal ring Insert vaginally and leave in place for 4 weeks. Use continuously. 3 each 4   meloxicam  (MOBIC ) 15 MG tablet Take 1 tablet (15 mg total) by mouth daily as needed for pain. 30 tablet 11   fluconazole  (DIFLUCAN ) 150 MG tablet Take 1 tablet (150 mg total) by mouth once as needed for up to 1 dose. Can take additional dose three days later if symptoms persist (Patient not taking: Reported on 08/05/2024) 2 tablet 0   No current facility-administered medications on file prior to visit.    No Known Allergies   Review of Systems Constitutional: negative for chills, fatigue, fevers and sweats Eyes: negative for irritation, redness and visual disturbance Ears, nose, mouth, throat, and face: negative for hearing loss, nasal congestion, snoring and tinnitus Respiratory: negative for asthma, cough, sputum Cardiovascular: negative for chest pain, dyspnea, exertional chest pressure/discomfort, irregular heart beat, palpitations and syncope Gastrointestinal: negative for abdominal pain, change in bowel habits, nausea and vomiting Genitourinary: negative for abnormal menstrual periods, genital lesions, sexual problems and vaginal discharge, dysuria and urinary incontinence Integument/breast: negative for breast lump, breast tenderness and nipple discharge Hematologic/lymphatic: negative for bleeding and easy bruising Musculoskeletal:negative for back pain and muscle weakness Neurological: negative for dizziness, headaches, vertigo and weakness Endocrine: negative for diabetic symptoms including polydipsia, polyuria and skin dryness Allergic/Immunologic: negative for hay fever and urticaria      Objective:  Blood pressure 119/84, pulse 73, weight 152 lb (68.9 kg), last menstrual period 07/29/2024. Body mass index is 23.81 kg/m.    General Appearance:    Alert, cooperative, no distress, appears stated age   Head:    Normocephalic, without obvious abnormality, atraumatic  Eyes:    PERRL, conjunctiva/corneas clear, EOM's intact, both eyes  Ears:    Normal external ear canals, both ears  Nose:   Nares normal, septum midline, mucosa normal, no drainage or sinus tenderness  Throat:   Lips, mucosa, and tongue normal; teeth and gums normal  Neck:   Supple, symmetrical, trachea midline, no adenopathy; thyroid : no enlargement/tenderness/nodules; no carotid bruit or JVD  Back:     Symmetric, no curvature, ROM normal, no CVA tenderness  Lungs:     Clear to auscultation bilaterally, respirations unlabored  Chest Wall:    No tenderness or deformity   Heart:    Regular rate and rhythm, S1 and S2 normal, no murmur, rub or gallop  Breast Exam:    No tenderness, masses, or nipple abnormality  Abdomen:     Soft, non-tender, bowel sounds active all four quadrants, no masses,  no organomegaly.    Genitalia:    Pelvic:external genitalia normal, vagina without lesions, discharge, or tenderness. Cervix normal in appearance, no cervical motion tenderness, no adnexal masses or tenderness.  Uterus normal size, shape, mobile, regular contours, nontender.     Extremities:   Extremities normal, atraumatic, no cyanosis or edema  Pulses:   2+ and symmetric all extremities  Skin:   Skin color, texture, turgor normal, no rashes or lesions  Lymph nodes:   Cervical, supraclavicular, and axillary nodes normal  Neurologic:   CNII-XII intact, normal strength, sensation and reflexes throughout   .  Labs:  Lab Results  Component Value Date   WBC 5.0 07/31/2023   HGB 12.5 07/31/2023   HCT 38.1 07/31/2023   MCV 90 07/31/2023   PLT 282 07/31/2023    Lab Results  Component Value Date   CREATININE 0.85 07/31/2023   BUN 11 07/31/2023   NA 139 07/31/2023   K 4.0 07/31/2023   CL 103 07/31/2023   CO2 21 07/31/2023    Lab Results  Component Value Date   ALT 9 07/31/2023   AST 17 07/31/2023   ALKPHOS 54 07/31/2023    BILITOT 0.4 07/31/2023    Lab Results  Component Value Date   TSH 2.720 07/28/2024     Assessment:   1. Well woman exam with routine gynecological exam   2. Family history of breast cancer   3. Increased risk of breast cancer   4. Preventative health care      Plan:  Blood tests: ordered Breast self exam technique reviewed and patient encouraged to perform self-exam monthly. Contraception: NuvaRing vaginal inserts. Discussed healthy lifestyle modifications. Flu vaccine: will be given this week Follow up in 1 year for annual exam If her BP remains elevated, then she will stop Nuvaring.   Starla Harland BROCKS, MD [facility]

## 2024-08-07 DIAGNOSIS — Z01419 Encounter for gynecological examination (general) (routine) without abnormal findings: Secondary | ICD-10-CM | POA: Diagnosis not present

## 2024-08-07 DIAGNOSIS — Z0000 Encounter for general adult medical examination without abnormal findings: Secondary | ICD-10-CM | POA: Diagnosis not present

## 2024-08-08 LAB — COMPREHENSIVE METABOLIC PANEL WITH GFR
ALT: 13 IU/L (ref 0–32)
AST: 16 IU/L (ref 0–40)
Albumin: 4.6 g/dL (ref 3.8–4.9)
Alkaline Phosphatase: 64 IU/L (ref 49–135)
BUN/Creatinine Ratio: 14 (ref 9–23)
BUN: 13 mg/dL (ref 6–24)
Bilirubin Total: 0.4 mg/dL (ref 0.0–1.2)
CO2: 21 mmol/L (ref 20–29)
Calcium: 9.5 mg/dL (ref 8.7–10.2)
Chloride: 101 mmol/L (ref 96–106)
Creatinine, Ser: 0.9 mg/dL (ref 0.57–1.00)
Globulin, Total: 2.3 g/dL (ref 1.5–4.5)
Glucose: 86 mg/dL (ref 70–99)
Potassium: 4.4 mmol/L (ref 3.5–5.2)
Sodium: 137 mmol/L (ref 134–144)
Total Protein: 6.9 g/dL (ref 6.0–8.5)
eGFR: 77 mL/min/1.73 (ref >59)

## 2024-08-08 LAB — LIPID PANEL
Chol/HDL Ratio: 2.6 ratio (ref 0.0–4.4)
Cholesterol, Total: 197 mg/dL (ref 100–199)
HDL: 75 mg/dL (ref >39)
LDL Chol Calc (NIH): 104 mg/dL — ABNORMAL HIGH (ref 0–99)
Triglycerides: 100 mg/dL (ref 0–149)
VLDL Cholesterol Cal: 18 mg/dL (ref 5–40)

## 2024-08-08 LAB — VITAMIN D 25 HYDROXY (VIT D DEFICIENCY, FRACTURES): Vit D, 25-Hydroxy: 59.4 ng/mL (ref 30.0–100.0)

## 2024-08-08 LAB — CBC
Hematocrit: 41.7 % (ref 34.0–46.6)
Hemoglobin: 13.5 g/dL (ref 11.1–15.9)
MCH: 29.2 pg (ref 26.6–33.0)
MCHC: 32.4 g/dL (ref 31.5–35.7)
MCV: 90 fL (ref 79–97)
Platelets: 309 x10E3/uL (ref 150–450)
RBC: 4.62 x10E6/uL (ref 3.77–5.28)
RDW: 12.4 % (ref 11.7–15.4)
WBC: 4.9 x10E3/uL (ref 3.4–10.8)

## 2024-08-08 LAB — HEMOGLOBIN A1C
Est. average glucose Bld gHb Est-mCnc: 105 mg/dL
Hgb A1c MFr Bld: 5.3 % (ref 4.8–5.6)

## 2024-08-25 ENCOUNTER — Other Ambulatory Visit: Payer: Self-pay | Admitting: Obstetrics & Gynecology

## 2024-08-25 ENCOUNTER — Other Ambulatory Visit: Payer: Self-pay

## 2024-08-25 DIAGNOSIS — Z3044 Encounter for surveillance of vaginal ring hormonal contraceptive device: Secondary | ICD-10-CM

## 2024-08-25 MED ORDER — ETONOGESTREL-ETHINYL ESTRADIOL 0.12-0.015 MG/24HR VA RING
VAGINAL_RING | VAGINAL | 4 refills | Status: AC
  Filled 2024-08-25: qty 3, 84d supply, fill #0
  Filled 2024-11-13: qty 3, 84d supply, fill #1

## 2024-09-02 ENCOUNTER — Encounter: Payer: Self-pay | Admitting: Obstetrics

## 2024-09-03 ENCOUNTER — Ambulatory Visit (INDEPENDENT_AMBULATORY_CARE_PROVIDER_SITE_OTHER)

## 2024-09-03 DIAGNOSIS — L821 Other seborrheic keratosis: Secondary | ICD-10-CM | POA: Diagnosis not present

## 2024-09-03 DIAGNOSIS — W908XXA Exposure to other nonionizing radiation, initial encounter: Secondary | ICD-10-CM | POA: Diagnosis not present

## 2024-09-03 DIAGNOSIS — Z1283 Encounter for screening for malignant neoplasm of skin: Secondary | ICD-10-CM

## 2024-09-03 DIAGNOSIS — D1801 Hemangioma of skin and subcutaneous tissue: Secondary | ICD-10-CM

## 2024-09-03 DIAGNOSIS — L578 Other skin changes due to chronic exposure to nonionizing radiation: Secondary | ICD-10-CM

## 2024-09-03 DIAGNOSIS — B353 Tinea pedis: Secondary | ICD-10-CM | POA: Diagnosis not present

## 2024-09-03 DIAGNOSIS — L814 Other melanin hyperpigmentation: Secondary | ICD-10-CM | POA: Diagnosis not present

## 2024-09-03 DIAGNOSIS — D229 Melanocytic nevi, unspecified: Secondary | ICD-10-CM

## 2024-09-03 NOTE — Progress Notes (Signed)
    Subjective   Cynthia Haas is a 53 y.o. female who presents for the following: Total body skin exam for skin cancer screening and mole check. The patient has spots, moles and lesions to be evaluated, some may be new or changing and the patient may have concern these could be cancer.. Patient is new patient  Today patient reports: Rash on her feet treating with otc fungal topicals with a poor response  Review of Systems:    No other skin or systemic complaints except as noted in HPI or Assessment and Plan.  The following portions of the chart were reviewed this encounter and updated as appropriate: medications, allergies, medical history  Relevant Medical History:  reviewed  Objective  Well appearing patient in no apparent distress; mood and affect are within normal limits. Examination was performed of the: Full Skin Examination: scalp, head, eyes, ears, nose, lips, neck, chest, axillae, abdomen, back, buttocks, bilateral upper extremities, bilateral lower extremities, hands, feet, fingers, toes, fingernails, and toenails.   Examination notable for: Angioma(s): Scattered red vascular papule(s)  , Lentigo/lentigines: Scattered pigmented macules that are tan to brown in color and are somewhat non-uniform in shape and concentrated in the sun-exposed areas, Nevus/nevi: Scattered well-demarcated, regular, pigmented macule(s) and/or papule(s)  , Seborrheic Keratosis(es): Stuck-on appearing keratotic papule(s) on the trunk, none  irritated with redness, crusting, edema, and/or partial avulsion, Actinic Damage/Elastosis: chronic sun damage: dyspigmentation, telangiectasia, and wrinkling  Examination limited by: Undergarments and Patient deferred removal       Assessment & Plan   SKIN CANCER SCREENING PERFORMED TODAY.  BENIGN SKIN FINDINGS  - Lentigines  - Seborrheic keratoses  - Hemangiomas   - Nevus/Multiple Benign Nevi - Reassurance provided regarding the benign appearance of lesions  noted on exam today; no treatment is indicated in the absence of symptoms/changes. - Reinforced importance of photoprotective strategies including liberal and frequent sunscreen use of a broad-spectrum SPF 30 or greater, use of protective clothing, and sun avoidance for prevention of cutaneous malignancy and photoaging.  Counseled patient on the importance of regular self-skin monitoring as well as routine clinical skin examinations as scheduled.   ACTINIC DAMAGE - Chronic condition, secondary to cumulative UV/sun exposure - Recommend daily broad spectrum sunscreen SPF 30+ to sun-exposed areas, reapply every 2 hours as needed.  - Staying in the shade or wearing long sleeves, sun glasses (UVA+UVB protection) and wide brim hats (4-inch brim around the entire circumference of the hat) are also recommended for sun protection.  - Call for new or changing lesions.   Tinea pedis of feet  Counseled patient on the benign, but difficult to treat, nature of this condition Discussed multiple treatment options with patient including watchful waiting, topical treatment, and oral therapy Recommended OTC butenafine (Lotrimin Ultra) or terbinafine cream PRN. - recommended maintenance spray for shoes and socks   Procedures, orders, diagnosis for this visit:    There are no diagnoses linked to this encounter.  Return to clinic: Return in about 1 year (around 09/03/2025) for TBSE .  IFay Kirks, CMA, am acting as scribe for Lauraine JAYSON Kanaris, MD .   Documentation: I have reviewed the above documentation for accuracy and completeness, and I agree with the above.  Lauraine JAYSON Kanaris, MD

## 2024-09-03 NOTE — Patient Instructions (Signed)

## 2024-09-16 ENCOUNTER — Other Ambulatory Visit: Payer: Self-pay

## 2024-09-16 ENCOUNTER — Other Ambulatory Visit: Payer: Self-pay | Admitting: Obstetrics & Gynecology

## 2024-09-16 DIAGNOSIS — M545 Low back pain, unspecified: Secondary | ICD-10-CM

## 2024-09-16 MED ORDER — MELOXICAM 15 MG PO TABS
15.0000 mg | ORAL_TABLET | Freq: Every day | ORAL | 11 refills | Status: AC | PRN
  Filled 2024-09-16: qty 90, 90d supply, fill #0

## 2024-09-16 NOTE — Progress Notes (Signed)
 Meloxicam refilled

## 2024-11-13 ENCOUNTER — Other Ambulatory Visit (HOSPITAL_COMMUNITY): Payer: Self-pay

## 2024-11-14 ENCOUNTER — Other Ambulatory Visit (HOSPITAL_COMMUNITY): Payer: Self-pay

## 2024-11-17 ENCOUNTER — Other Ambulatory Visit: Payer: Self-pay

## 2024-12-08 ENCOUNTER — Other Ambulatory Visit (HOSPITAL_COMMUNITY): Payer: Self-pay

## 2024-12-09 ENCOUNTER — Other Ambulatory Visit: Payer: Self-pay | Admitting: Obstetrics & Gynecology

## 2024-12-09 DIAGNOSIS — Z803 Family history of malignant neoplasm of breast: Secondary | ICD-10-CM

## 2024-12-09 DIAGNOSIS — Z9189 Other specified personal risk factors, not elsewhere classified: Secondary | ICD-10-CM

## 2024-12-09 NOTE — Progress Notes (Signed)
 Screening MRI ordered due to increased risk of breast cancer

## 2024-12-11 NOTE — Addendum Note (Signed)
 Addended by: CHARMA DOMINO on: 12/11/2024 04:45 PM   Modules accepted: Orders

## 2024-12-26 ENCOUNTER — Ambulatory Visit

## 2025-01-05 ENCOUNTER — Ambulatory Visit: Admitting: Physician Assistant

## 2025-09-02 ENCOUNTER — Ambulatory Visit
